# Patient Record
Sex: Male | Born: 1975
Health system: Southern US, Community
[De-identification: ages and names within clinical notes are randomized; demographics above are authoritative.]

## PROBLEM LIST (undated history)

## (undated) DIAGNOSIS — M3119 Other thrombotic microangiopathy: Secondary | ICD-10-CM

## (undated) DIAGNOSIS — M311 Thrombotic microangiopathy: Secondary | ICD-10-CM

## (undated) HISTORY — PX: ANKLE ARTHROPLASTY: SUR68

---
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
## 1999-01-22 ENCOUNTER — Encounter: Payer: Self-pay | Admitting: Emergency Medicine

## 1999-01-22 ENCOUNTER — Emergency Department (HOSPITAL_COMMUNITY): Admission: EM | Admit: 1999-01-22 | Discharge: 1999-01-22 | Payer: Self-pay | Admitting: Emergency Medicine

## 1999-06-22 ENCOUNTER — Emergency Department (HOSPITAL_COMMUNITY): Admission: EM | Admit: 1999-06-22 | Discharge: 1999-06-22 | Payer: Self-pay | Admitting: *Deleted

## 2008-06-11 ENCOUNTER — Emergency Department (HOSPITAL_BASED_OUTPATIENT_CLINIC_OR_DEPARTMENT_OTHER): Admission: EM | Admit: 2008-06-11 | Discharge: 2008-06-11 | Payer: Self-pay | Admitting: Emergency Medicine

## 2008-09-15 ENCOUNTER — Emergency Department (HOSPITAL_BASED_OUTPATIENT_CLINIC_OR_DEPARTMENT_OTHER): Admission: EM | Admit: 2008-09-15 | Discharge: 2008-09-15 | Payer: Self-pay | Admitting: Emergency Medicine

## 2009-07-31 ENCOUNTER — Emergency Department (HOSPITAL_BASED_OUTPATIENT_CLINIC_OR_DEPARTMENT_OTHER): Admission: EM | Admit: 2009-07-31 | Discharge: 2009-07-31 | Payer: Self-pay | Admitting: Emergency Medicine

## 2009-07-31 ENCOUNTER — Emergency Department (HOSPITAL_COMMUNITY): Admission: EM | Admit: 2009-07-31 | Discharge: 2009-07-31 | Payer: Self-pay | Admitting: Emergency Medicine

## 2009-07-31 ENCOUNTER — Ambulatory Visit: Payer: Self-pay | Admitting: Diagnostic Radiology

## 2010-12-18 LAB — GC/CHLAMYDIA PROBE AMP, GENITAL
Chlamydia, DNA Probe: NEGATIVE
GC Probe Amp, Genital: NEGATIVE

## 2011-06-05 LAB — POCT CARDIAC MARKERS: Troponin i, poc: <0.05

## 2011-06-05 LAB — DIFFERENTIAL
Basophils Absolute: 0.1
Lymphocytes Relative: 43
Lymphs Abs: 3.3
Monocytes Absolute: 0.5
Neutro Abs: 3.6

## 2011-06-05 LAB — CBC
HCT: 45
Hemoglobin: 15.1
RBC: 5.4
RDW: 13
WBC: 7.6

## 2011-11-07 ENCOUNTER — Encounter (HOSPITAL_BASED_OUTPATIENT_CLINIC_OR_DEPARTMENT_OTHER): Payer: Self-pay

## 2011-11-07 ENCOUNTER — Emergency Department (HOSPITAL_BASED_OUTPATIENT_CLINIC_OR_DEPARTMENT_OTHER)
Admission: EM | Admit: 2011-11-07 | Discharge: 2011-11-07 | Disposition: A | Discharge status: Home Health | Payer: Self-pay | Attending: Emergency Medicine | Admitting: Emergency Medicine

## 2011-11-07 ENCOUNTER — Other Ambulatory Visit: Payer: Self-pay

## 2011-11-07 DIAGNOSIS — R229 Localized swelling, mass and lump, unspecified: Secondary | ICD-10-CM | POA: Insufficient documentation

## 2011-11-07 DIAGNOSIS — R5381 Other malaise: Secondary | ICD-10-CM | POA: Insufficient documentation

## 2011-11-07 DIAGNOSIS — R42 Dizziness and giddiness: Secondary | ICD-10-CM | POA: Insufficient documentation

## 2011-11-07 DIAGNOSIS — L03319 Cellulitis of trunk, unspecified: Secondary | ICD-10-CM | POA: Insufficient documentation

## 2011-11-07 DIAGNOSIS — L02214 Cutaneous abscess of groin: Secondary | ICD-10-CM

## 2011-11-07 DIAGNOSIS — E860 Dehydration: Secondary | ICD-10-CM

## 2011-11-07 DIAGNOSIS — F17200 Nicotine dependence, unspecified, uncomplicated: Secondary | ICD-10-CM | POA: Insufficient documentation

## 2011-11-07 DIAGNOSIS — R5383 Other fatigue: Secondary | ICD-10-CM

## 2011-11-07 DIAGNOSIS — L02219 Cutaneous abscess of trunk, unspecified: Secondary | ICD-10-CM | POA: Insufficient documentation

## 2011-11-07 LAB — BASIC METABOLIC PANEL
BUN: 14 mg/dL (ref 6–23)
CO2: 23 mEq/L (ref 19–32)
Chloride: 102 mEq/L (ref 96–112)
GFR calc Af Amer: 90 mL/min — ABNORMAL LOW (ref >90)
Potassium: 3.5 mEq/L (ref 3.5–5.1)

## 2011-11-07 LAB — CBC
HCT: 44.1 % (ref 39.0–52.0)
Platelets: 280 10*3/uL (ref 150–400)
RBC: 5.33 MIL/uL (ref 4.22–5.81)
RDW: 14 % (ref 11.5–15.5)
WBC: 10.9 10*3/uL — ABNORMAL HIGH (ref 4.0–10.5)

## 2011-11-07 LAB — URINALYSIS, ROUTINE W REFLEX MICROSCOPIC
Leukocytes, UA: NEGATIVE
Nitrite: NEGATIVE
Specific Gravity, Urine: 1.029 (ref 1.005–1.030)
pH: 5.5 (ref 5.0–8.0)

## 2011-11-07 MED ORDER — SULFAMETHOXAZOLE-TRIMETHOPRIM 800-160 MG PO TABS: 1.0000 | ORAL_TABLET | Freq: Two times a day (BID) | ORAL | Status: AC

## 2011-11-07 MED ORDER — SULFAMETHOXAZOLE-TMP DS 800-160 MG PO TABS
1.0000 | ORAL_TABLET | Freq: Once | ORAL | Status: AC
  Administered 2011-11-07: 1 via ORAL
  Filled 2011-11-07: qty 1

## 2011-11-07 NOTE — ED Notes (Signed)
Pt sts abcess on right side there for 1 wk,also having chest pain moving down to left arm,pressure

## 2011-11-07 NOTE — ED Provider Notes (Signed)
History     CSN: [id number]  Arrival date & time 11/07/11  0155   None     Chief Complaint  Patient presents with  . Groin Swelling    (Consider location/radiation/quality/duration/timing/severity/associated sxs/prior treatment) HPI Comments: 36 year old male with no past medical history who presents with a complaint of abscess to the left groin. He states that this has been going on for approximately one week, gradually getting bigger until approximately 2 nights ago when the abscess ruptured allowing purulent material to come out. Since that time the pain has improved significantly, the abscess has continued to drain purulent material and has decreased in size significantly. He denies associated fevers nausea vomiting chills diarrhea. He does admit to having some lightheadedness with standing, dark-colored urine, admits to not drinking very much fluids and feeling generalized fatigue over the last couple of weeks. He states that he does not eat very frequently sometimes only one meal a day, continues to smoke cigarettes and states that "I just don't take care of myself like I should."  He does state that over the last 3 days he has had a dull feeling in his chest that gets worse when he takes a big breath and goes away when breathing normally. This is not exertional or positional. He denies coughing shortness of breath or swelling of the legs.    The history is provided by the patient.    History reviewed. No pertinent past medical history.  Past Surgical History  Procedure Date  . Ankle arthroplasty     No family history on file.  History  Substance Use Topics  . Smoking status: Current Everyday Smoker -- 2 years    Types: Cigars  . Smokeless tobacco: Not on file  . Alcohol Use: No      Review of Systems  Constitutional: Positive for fatigue. Negative for fever and chills.  HENT: Negative for sore throat and neck pain.   Eyes: Negative for visual disturbance.    Respiratory: Negative for cough and shortness of breath.   Cardiovascular: Positive for chest pain.  Gastrointestinal: Negative for nausea, vomiting, abdominal pain and diarrhea.  Genitourinary: Negative for dysuria and frequency.  Musculoskeletal: Negative for back pain.  Skin: Positive for rash.  Neurological: Negative for weakness, numbness and headaches.  Hematological: Negative for adenopathy.  Psychiatric/Behavioral: Negative for behavioral problems.    Allergies  Review of patient's allergies indicates no known allergies.  Home Medications   Current Outpatient Rx  Name Route Sig Dispense Refill  . SULFAMETHOXAZOLE-TRIMETHOPRIM 800-160 MG PO TABS Oral Take 1 tablet by mouth every 12 (twelve) hours. 20 tablet 0    BP 125/81  Pulse 88  Temp(Src) 97.7 F (36.5 C) (Oral)  Resp 20  SpO2 99%  Physical Exam  Nursing note and vitals reviewed. Constitutional: He appears well-developed and well-nourished. No distress.  HENT:  Head: Normocephalic and atraumatic.  Mouth/Throat: Oropharynx is clear and moist. No oropharyngeal exudate.  Eyes: Conjunctivae and EOM are normal. Pupils are equal, round, and reactive to light. Right eye exhibits no discharge. Left eye exhibits no discharge. No scleral icterus.  Neck: Normal range of motion. Neck supple. No JVD present. No thyromegaly present.  Cardiovascular: Normal rate, regular rhythm, normal heart sounds and intact distal pulses.  Exam reveals no gallop and no friction rub.   No murmur heard. Pulmonary/Chest: Effort normal and breath sounds normal. No respiratory distress. He has no wheezes. He has no rales.  Abdominal: Soft. Bowel sounds are normal. He exhibits  no distension and no mass. There is no tenderness.  Musculoskeletal: Normal range of motion. He exhibits no edema and no tenderness.  Lymphadenopathy:    He has no cervical adenopathy.  Neurological: He is alert. Coordination normal.  Skin: Skin is warm and dry. Rash  (Approximately 1.5 cm draining abscess in the left groin, not involving the scrotum or testicles. This is open, no purulent material seen, minimally tender without surrounding erythema or induration.) noted. No erythema.  Psychiatric: He has a normal mood and affect. His behavior is normal.    ED Course  Procedures (including critical care time)  ED ECG REPORT   Date: 11/07/2011   Rate: 80  Rhythm: normal sinus rhythm  QRS Axis: normal  Intervals: normal  ST/T Wave abnormalities: normal  Conduction Disutrbances:none  Narrative Interpretation: Normal ECG  Old EKG Reviewed: none available      Labs Reviewed  CBC - Abnormal; Notable for the following:    WBC 10.9 (*)    All other components within normal limits  BASIC METABOLIC PANEL - Abnormal; Notable for the following:    Glucose, Bld 152 (*)    GFR calc non Af Amer 78 (*)    GFR calc Af Amer 90 (*)    All other components within normal limits  URINALYSIS, ROUTINE W REFLEX MICROSCOPIC   No results found.   1. Abscess of groin, left   2. Fatigue   3. Dehydration       MDM  EKG is normal, will check to rule out diabetes, renal dysfunction, anemia, urinary tract infection. I suspect that dehydration is a big part of his symptom complex and have encouraged by mouth fluid intake.   Pt started on Bactrim, labs reviewed showing no signs of leukocytosis, anemia, renal failure or lyte abnormalities - his UA showed high SG, but no infection.  Fluids given by mouth, tolerated - started on bactrim, home with f/u - list for f/u given.  ECG is normal, pt has minimal non exertional sx and is very unlikely to be cardiac.  VS are normal without hypertension, hypoxia or fever.  Pulse of 88.  Discharge Prescriptions include:  #1 Bactrim     Vida Roller, MD 11/07/11 [phone]

## 2011-11-07 NOTE — Discharge Instructions (Signed)
Your tests have shown mild dehydration, but otherwise your blood counts and electrolytes and kidney tests were all normal - drink plenty of fluids - take the medicine called bactrim for your infection and return to your doctor or the ER for severe or worsening swelling, redness, or fevers.  STOP SMOKING immediately  RESOURCE GUIDE  Dental Problems  Patients with Medicaid: [facility] [phone] [address]                                           [phone] [address] Phone:  [phone]                                                  Phone:  [phone]  If unable to pay or uninsured, contact:  [facility] or [facility] to become qualified for the adult dental clinic.  Chronic Pain Problems Contact [facility]  [phone] Patients need to be referred by their primary care doctor.  Insufficient Money for Medicine Contact [employer]:  call "211" or [facility] [phone].  No Primary Care Doctor Call [facility]  [phone] Other agencies that provide inexpensive medical care    [facility]  [phone]    [facility]  [phone]    [facility]  [phone]    [facility]  [phone]    [facility]  [phone]    [facility]  [phone]  Psychological Services [facility] Behavioral Health  [phone] [facility] Services  [phone] [facility] Mental Health   [phone] (emergency services [phone])  Substance Abuse Resources [facility]  ([phone] [facility] [phone] [facility] [phone] Floydene Flock 3232574020 Residential & Outpatient Substance Abuse Program  [phone]  Abuse/Neglect [facility] Child Abuse Hotline 423-487-6330 [facility] Child Abuse Hotline (929) 525-2480 (After Hours)  Emergency Shelter [facility] [phone]) [phone]  Maternity Homes Room at  the [place] [facility] 516-686-1580 [facility] [phone]  MRSA Hotline #:   212-392-3637    [facility] Resources  [facility]     [employer]                          [facility] Dept. [address] [place]                       [address]      [address]  [place]                                                Cristobal Goldmann Phone:  [phone]  Phone:  [phone]                 Phone:  [phone]  [place] [facility] Phone:  [phone]  [place] Child Abuse Hotline (336) 342-1394 [phone]) [phone] (After Hours)   

## 2012-04-20 ENCOUNTER — Encounter (HOSPITAL_BASED_OUTPATIENT_CLINIC_OR_DEPARTMENT_OTHER): Payer: Self-pay | Admitting: *Deleted

## 2012-04-20 ENCOUNTER — Emergency Department (HOSPITAL_BASED_OUTPATIENT_CLINIC_OR_DEPARTMENT_OTHER)
Admission: EM | Admit: 2012-04-20 | Discharge: 2012-04-20 | Disposition: A | Discharge status: Home / Self Care | Payer: Self-pay | Attending: Emergency Medicine | Admitting: Emergency Medicine

## 2012-04-20 ENCOUNTER — Emergency Department (HOSPITAL_BASED_OUTPATIENT_CLINIC_OR_DEPARTMENT_OTHER): Payer: Self-pay

## 2012-04-20 DIAGNOSIS — R51 Headache: Secondary | ICD-10-CM | POA: Insufficient documentation

## 2012-04-20 DIAGNOSIS — F17200 Nicotine dependence, unspecified, uncomplicated: Secondary | ICD-10-CM | POA: Insufficient documentation

## 2012-04-20 LAB — CBC WITH DIFFERENTIAL/PLATELET
Basophils Relative: 0 % (ref 0–1)
Hemoglobin: 13.2 g/dL (ref 13.0–17.0)
Lymphocytes Relative: 45 % (ref 12–46)
Lymphs Abs: 4 10*3/uL (ref 0.7–4.0)
Monocytes Relative: 7 % (ref 3–12)
Neutro Abs: 4.1 10*3/uL (ref 1.7–7.7)
Neutrophils Relative %: 46 % (ref 43–77)
RBC: 4.75 MIL/uL (ref 4.22–5.81)

## 2012-04-20 LAB — BASIC METABOLIC PANEL
BUN: 14 mg/dL (ref 6–23)
Chloride: 102 mEq/L (ref 96–112)
GFR calc Af Amer: >90 mL/min (ref >90)
Glucose, Bld: 133 mg/dL — ABNORMAL HIGH (ref 70–99)
Potassium: 3.7 mEq/L (ref 3.5–5.1)

## 2012-04-20 MED ORDER — SODIUM CHLORIDE 0.9 % IV BOLUS (SEPSIS)
1000.0000 mL | Freq: Once | INTRAVENOUS | Status: AC
  Administered 2012-04-20: 1000 mL via INTRAVENOUS

## 2012-04-20 NOTE — ED Provider Notes (Signed)
History  This chart was scribed for Charles B. Bernette Mayers, MD by Shari Heritage. The patient was seen in room MH08/MH08. Patient's care was started at 1701.     CSN: [id number]  Arrival date & time 04/20/12  1701   First MD Initiated Contact with Patient 04/20/12 1709      Chief Complaint  Patient presents with  . Headache    The history is provided by the patient. No language interpreter was used.   Willie Cordova is a 36 y.o. male who presents to the Emergency Department complaining of a moderate to severe, throbbing HA onset several hours ago. Patient describes the pain as excruciating. He took Aleve with some moderate relief and the pain has improved significantly since the onset. No neck pain. No photophobia. No fever. Patient says that he has been hydrating by drinking about 1 gallon per day for the past 2 days. He states that he has been eating multiple small meals per day. He also drinks a Boost protein drink every morning. Patient says that he has been working with a new physical trainer since Thursday and has significantly increased his level of physical activity over the past few days. Patient says that he started using electronic cigarettes last week. He has been a current everyday smoker. He reports no significant medical history.   History reviewed. No pertinent past medical history.  Past Surgical History  Procedure Date  . Ankle arthroplasty     History reviewed. No pertinent family history.  History  Substance Use Topics  . Smoking status: Current Everyday Smoker -- 2 years    Types: Cigars  . Smokeless tobacco: Not on file  . Alcohol Use: No      Review of Systems A complete 10 system review of systems was obtained and all systems are negative except as noted in the HPI and PMH.   Allergies  Review of patient's allergies indicates no known allergies.  Home Medications  No current outpatient prescriptions on file.  BP 130/81  Pulse 74  Temp 98 F (36.7  C) (Oral)  Resp 20  Ht 6\' 1"  (1.854 m)  Wt 285 lb (129.275 kg)  BMI 37.60 kg/m2  SpO2 100%  Physical Exam  Nursing note and vitals reviewed. Constitutional: He is oriented to person, place, and time. He appears well-developed and well-nourished.  HENT:  Head: Normocephalic and atraumatic.  Eyes: EOM are normal. Pupils are equal, round, and reactive to light.  Neck: Normal range of motion. Neck supple.  Cardiovascular: Normal rate, normal heart sounds and intact distal pulses.   Pulmonary/Chest: Effort normal and breath sounds normal.  Abdominal: Bowel sounds are normal. He exhibits no distension. There is no tenderness.  Musculoskeletal: Normal range of motion. He exhibits no edema and no tenderness.  Neurological: He is alert and oriented to person, place, and time. He has normal strength. No cranial nerve deficit or sensory deficit.  Skin: Skin is warm and dry. No rash noted.  Psychiatric: He has a normal mood and affect.    ED Course  Procedures (including critical care time) DIAGNOSTIC STUDIES: Oxygen Saturation is 100% on room air, normal by my interpretation.    COORDINATION OF CARE: 5:22pm- Patient informed of current plan for treatment and evaluation and agrees with plan at this time.  Results for orders placed during the hospital encounter of 04/20/12  CBC WITH DIFFERENTIAL      Component Value Range   WBC 8.8  4.0 - 10.5 K/uL   RBC  4.75  4.22 - 5.81 MIL/uL   Hemoglobin 13.2  13.0 - 17.0 g/dL   HCT 24.4  01.0 - 27.2 %   MCV 82.5  78.0 - 100.0 fL   MCH 27.8  26.0 - 34.0 pg   MCHC 33.7  30.0 - 36.0 g/dL   RDW 53.6  64.4 - 03.4 %   Platelets 255  150 - 400 K/uL   Neutrophils Relative 46  43 - 77 %   Neutro Abs 4.1  1.7 - 7.7 K/uL   Lymphocytes Relative 45  12 - 46 %   Lymphs Abs 4.0  0.7 - 4.0 K/uL   Monocytes Relative 7  3 - 12 %   Monocytes Absolute 0.6  0.1 - 1.0 K/uL   Eosinophils Relative 1  0 - 5 %   Eosinophils Absolute 0.1  0.0 - 0.7 K/uL   Basophils  Relative 0  0 - 1 %   Basophils Absolute 0.0  0.0 - 0.1 K/uL  BASIC METABOLIC PANEL      Component Value Range   Sodium 141  135 - 145 mEq/L   Potassium 3.7  3.5 - 5.1 mEq/L   Chloride 102  96 - 112 mEq/L   CO2 26  19 - 32 mEq/L   Glucose, Bld 133 (*) 70 - 99 mg/dL   BUN 14  6 - 23 mg/dL   Creatinine, Ser 7.42  0.50 - 1.35 mg/dL   Calcium 9.4  8.4 - 59.5 mg/dL   GFR calc non Af Amer >90  >90 mL/min   GFR calc Af Amer >90  >90 mL/min   Ct Head Wo Contrast  04/20/2012  *RADIOLOGY REPORT*  Clinical Data: Headache.  CT HEAD WITHOUT CONTRAST  Technique:  Contiguous axial images were obtained from the base of the skull through the vertex without contrast.  Comparison: None.  Findings: The brain appears normal without evidence of infarct, hemorrhage, mass, mass effect, midline shift or abnormal extra- axial fluid collection.  There is no hydrocephalus or pneumocephalus.  The calvarium is intact.  Imaged paranasal sinuses and mastoid air cells are clear.  IMPRESSION: Negative exam.  Original Report Authenticated By: Bernadene Bell. Maricela Curet, M.D.       No diagnosis found.    MDM  Labs and imaging as above. Pt's headache has resolved. I had a long discussion with him regarding the diagnosis of SAH including LP. He will defer LP at this point. He was advised to return to the ER for any worsening headache or for any other concerns.      I personally performed the services described in the documentation, which were scribed in my presence. The recorded information has been reviewed and considered.     Charles B. Bernette Mayers, MD 04/20/12 6387

## 2012-04-20 NOTE — ED Notes (Signed)
Pt states he recently started working out with a trainer and while exercising develops "excruciating" headache. Took Aleve with some relief. Denies CP or other s/s.

## 2012-04-22 ENCOUNTER — Ambulatory Visit (HOSPITAL_BASED_OUTPATIENT_CLINIC_OR_DEPARTMENT_OTHER)
Admission: RE | Admit: 2012-04-22 | Discharge: 2012-04-22 | Disposition: A | Discharge status: Home / Self Care | Payer: Self-pay | Source: Ambulatory Visit | Attending: Emergency Medicine | Admitting: Emergency Medicine

## 2012-04-22 ENCOUNTER — Other Ambulatory Visit (HOSPITAL_BASED_OUTPATIENT_CLINIC_OR_DEPARTMENT_OTHER): Payer: Self-pay | Admitting: Emergency Medicine

## 2012-04-22 DIAGNOSIS — R51 Headache: Secondary | ICD-10-CM | POA: Insufficient documentation

## 2012-04-23 ENCOUNTER — Other Ambulatory Visit (HOSPITAL_BASED_OUTPATIENT_CLINIC_OR_DEPARTMENT_OTHER): Payer: Self-pay | Admitting: Emergency Medicine

## 2012-04-29 ENCOUNTER — Ambulatory Visit: Payer: Managed Care, Other (non HMO) | Admitting: Family

## 2012-04-29 DIAGNOSIS — Z0289 Encounter for other administrative examinations: Secondary | ICD-10-CM

## 2012-09-06 ENCOUNTER — Encounter (HOSPITAL_BASED_OUTPATIENT_CLINIC_OR_DEPARTMENT_OTHER): Payer: Self-pay | Admitting: *Deleted

## 2012-09-06 ENCOUNTER — Emergency Department (HOSPITAL_BASED_OUTPATIENT_CLINIC_OR_DEPARTMENT_OTHER)
Admission: EM | Admit: 2012-09-06 | Discharge: 2012-09-06 | Disposition: A | Discharge status: Home / Self Care | Payer: Self-pay | Attending: Emergency Medicine | Admitting: Emergency Medicine

## 2012-09-06 DIAGNOSIS — K044 Acute apical periodontitis of pulpal origin: Secondary | ICD-10-CM | POA: Insufficient documentation

## 2012-09-06 DIAGNOSIS — R51 Headache: Secondary | ICD-10-CM | POA: Insufficient documentation

## 2012-09-06 DIAGNOSIS — F17200 Nicotine dependence, unspecified, uncomplicated: Secondary | ICD-10-CM | POA: Insufficient documentation

## 2012-09-06 DIAGNOSIS — K047 Periapical abscess without sinus: Secondary | ICD-10-CM

## 2012-09-06 MED ORDER — OXYCODONE-ACETAMINOPHEN 5-325 MG PO TABS: 1.0000 | ORAL_TABLET | Freq: Four times a day (QID) | ORAL | Status: DC | PRN

## 2012-09-06 MED ORDER — ACETAMINOPHEN 325 MG PO TABS
650.0000 mg | ORAL_TABLET | Freq: Once | ORAL | Status: AC
  Administered 2012-09-06: 650 mg via ORAL
  Filled 2012-09-06: qty 2

## 2012-09-06 MED ORDER — AMOXICILLIN 500 MG PO CAPS: 500.0000 mg | ORAL_CAPSULE | Freq: Three times a day (TID) | ORAL | Status: DC

## 2012-09-06 NOTE — ED Provider Notes (Signed)
History     CSN: [id number]  Arrival date & time 09/06/12  7829   First MD Initiated Contact with Patient 09/06/12 1829      Chief Complaint  Patient presents with  . Dental Pain    (Consider location/radiation/quality/duration/timing/severity/associated sxs/prior treatment) HPI Comments: 37 year old male presents emergency department complaining of left lower tooth pain for the past couple weeks, worsening over the past few days. States he's had a problem on and off with this tooth in the past. He does not have a primary dentist. States he feels as if the left side of his face is beginning to swell. Describes the pain as sharp rated 10/10. Denies fever or chills. No difficulty swallowing. He tried taking BC powder without any relief of his pain. Nothing in specific makes the pain worse.  Patient is a 37 y.o. male presenting with tooth pain. The history is provided by the patient.  Dental PainThe primary symptoms include headaches. Primary symptoms do not include fever.  Additional symptoms do not include: trouble swallowing.    History reviewed. No pertinent past medical history.  Past Surgical History  Procedure Date  . Ankle arthroplasty     History reviewed. No pertinent family history.  History  Substance Use Topics  . Smoking status: Current Every Day Smoker -- 2 years    Types: Cigars  . Smokeless tobacco: Not on file  . Alcohol Use: No      Review of Systems  Constitutional: Negative for fever and chills.  HENT: Positive for dental problem. Negative for trouble swallowing.   Gastrointestinal: Negative for nausea and vomiting.  Neurological: Positive for headaches.    Allergies  Review of patient's allergies indicates no known allergies.  Home Medications  No current outpatient prescriptions on file.  BP 146/99  Pulse 75  Temp 98.6 F (37 C)  Resp 16  Ht 6\' 6"  (1.981 m)  Wt 275 lb (124.739 kg)  BMI 31.78 kg/m2  SpO2 99%  Physical Exam  Nursing  note and vitals reviewed. Constitutional: He is oriented to person, place, and time. He appears well-developed and well-nourished. No distress.  HENT:  Head: Normocephalic and atraumatic.    Mouth/Throat: Uvula is midline and oropharynx is clear and moist. Abnormal dentition. Dental caries present. No dental abscesses.    Eyes: Conjunctivae normal are normal.  Neck: Normal range of motion. Neck supple.  Cardiovascular: Normal rate, regular rhythm and normal heart sounds.   Pulmonary/Chest: Effort normal and breath sounds normal.  Musculoskeletal: Normal range of motion. He exhibits no edema.  Lymphadenopathy:       Head (left side): Submandibular adenopathy present.  Neurological: He is alert and oriented to person, place, and time.  Skin: Skin is warm and dry.  Psychiatric: He has a normal mood and affect. His behavior is normal.    ED Course  Procedures (including critical care time)  Labs Reviewed - No data to display No results found.   1. Dental infection       MDM   Dental pain associated with dental infection. No evidence of dental abscess. Patient is afebrile, non toxic appearing and swallowing secretions well. I gave patient referral to dentist and stressed the importance of dental follow up for ultimate management of dental pain. I will also give amoxicillin and pain control. Patient voices understanding and is agreeable to plan.         Trevor Mace, PA-C 09/06/12 1844

## 2012-09-06 NOTE — ED Notes (Signed)
Pt c/o wisdom tooth pain x 2 days

## 2012-09-06 NOTE — ED Provider Notes (Signed)
Medical screening examination/treatment/procedure(s) were performed by non-physician practitioner and as supervising physician I was immediately available for consultation/collaboration.   Dione Booze, MD 09/06/12 [phone]

## 2013-09-07 IMAGING — CT CT HEAD W/O CM
1 series · 16 of 30 positions shown, 20 images · non-contrast
Comparison: None.

CLINICAL DATA: Headache.

CT HEAD WITHOUT CONTRAST
TECHNIQUE: Contiguous axial images were obtained from the base of
the skull through the vertex without contrast.

[Series 2: head 4.8 h37s · axial · 0.51mm/px · z∈[-104,+56]mm · 16 of 36 slices shown, 20 images]
[im 2/36  brain]
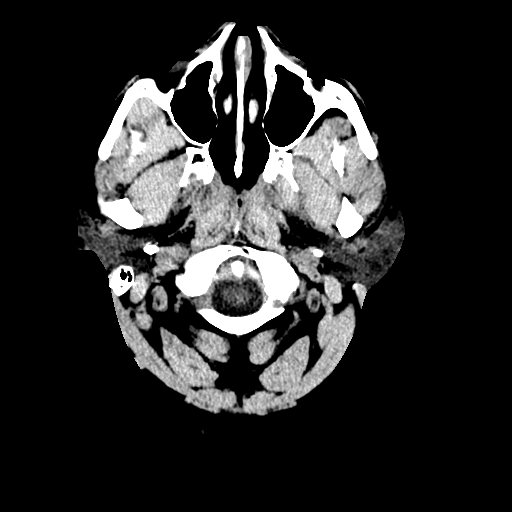
[im 2/36  bone]
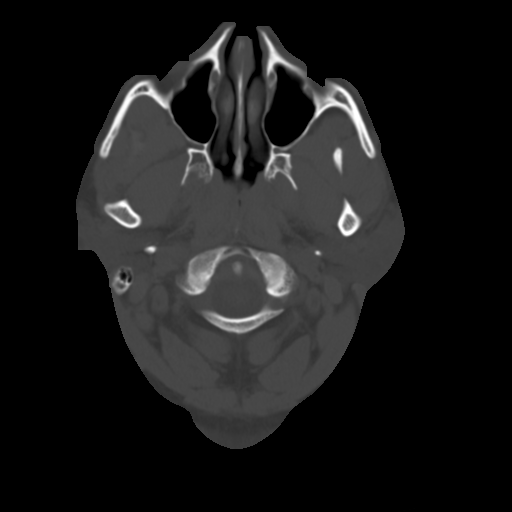
[im 4/36  brain]
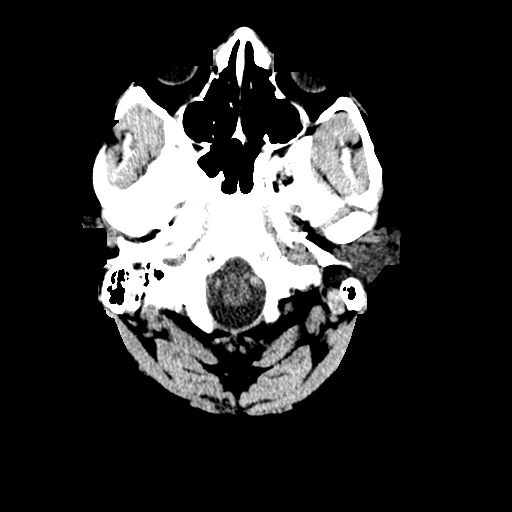
[im 7/36  brain]
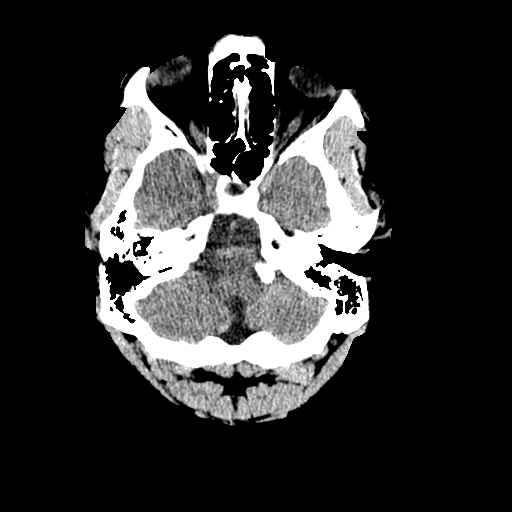
[im 9/36  brain]
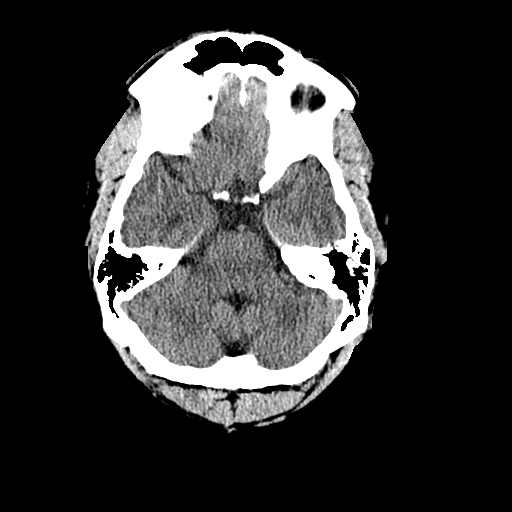
[im 10/36  brain]
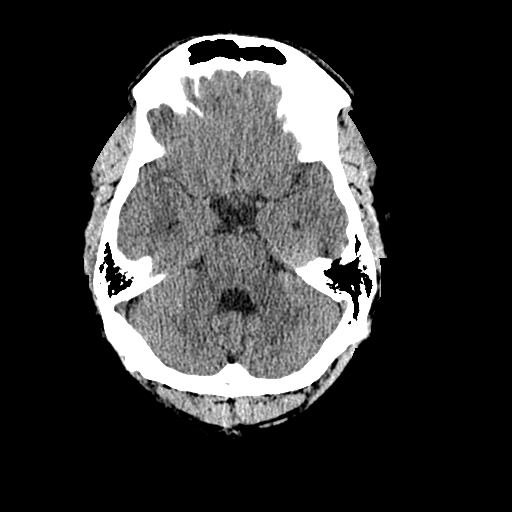
[im 10/36  bone]
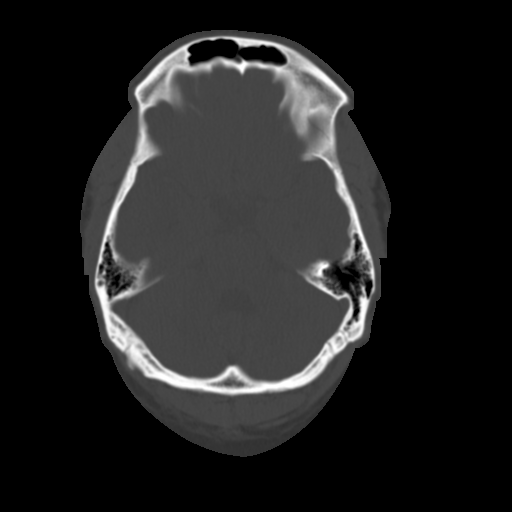
[im 13/36  brain]
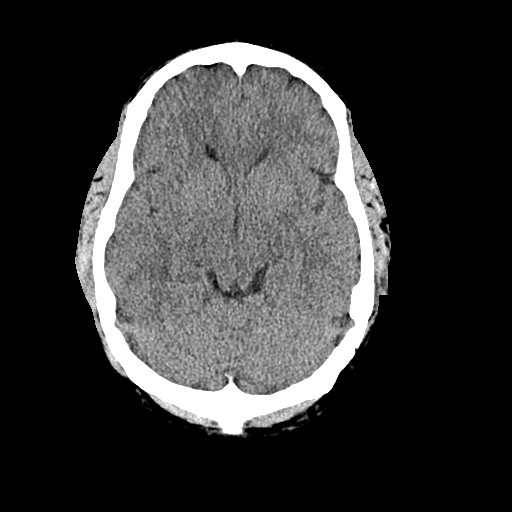
[im 15/36  brain]
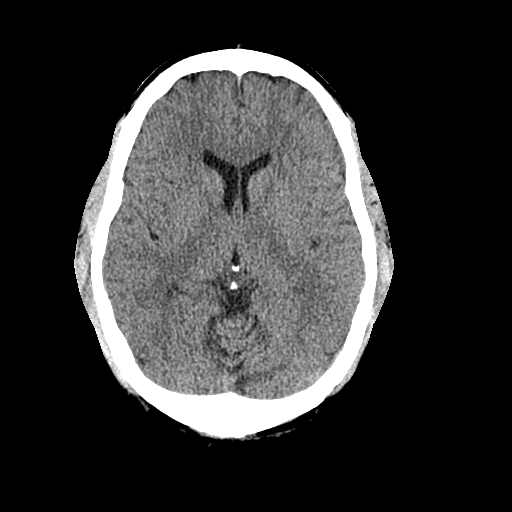
[im 17/36  brain]
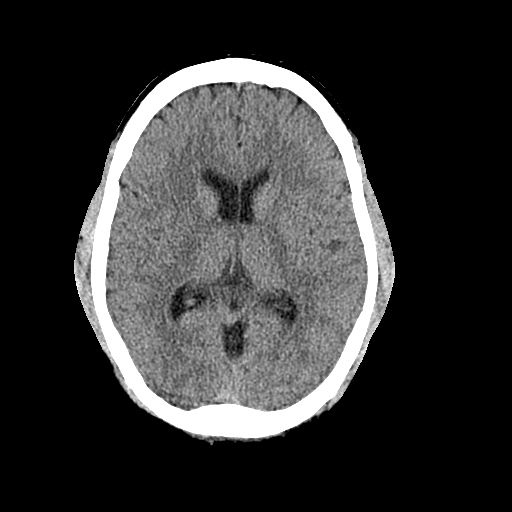
[im 19/36  brain]
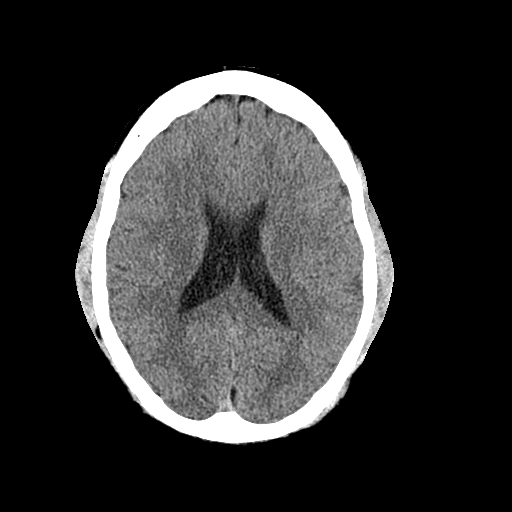
[im 19/36  bone]
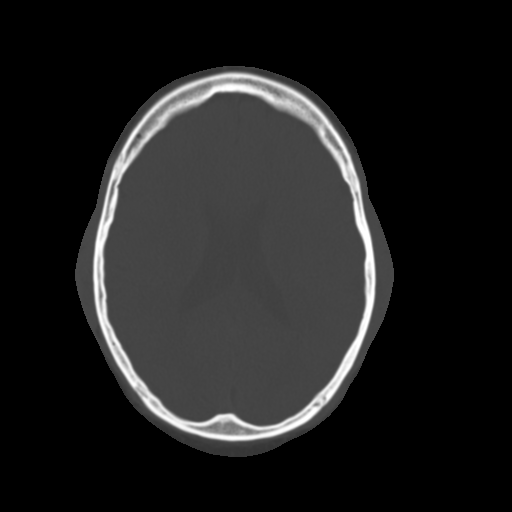
[im 21/36  brain]
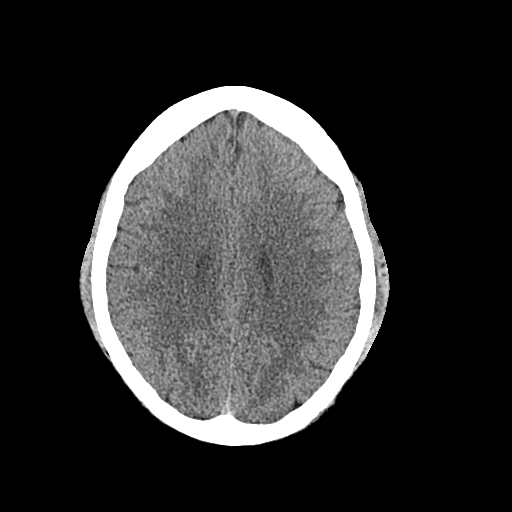
[im 23/36  brain]
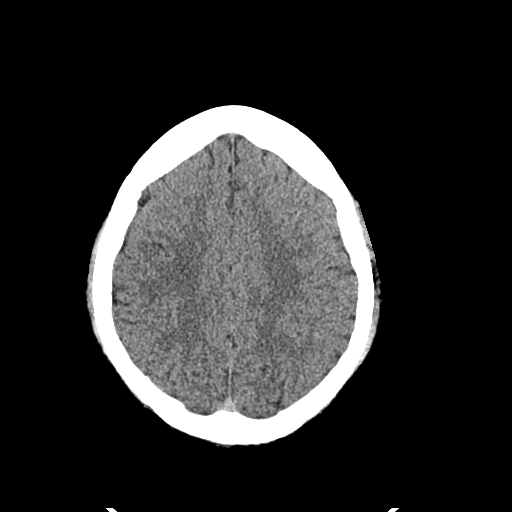
[im 26/36  brain]
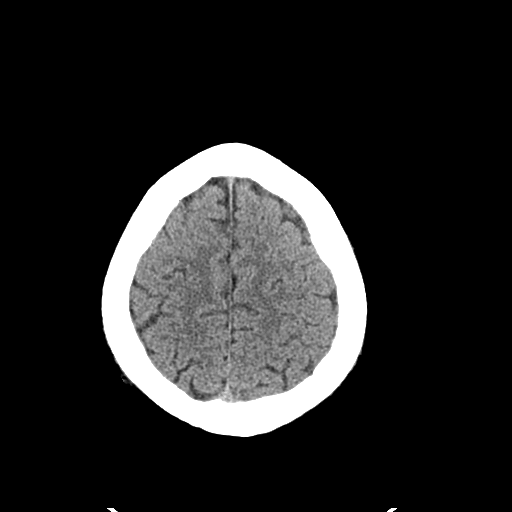
[im 27/36  brain]
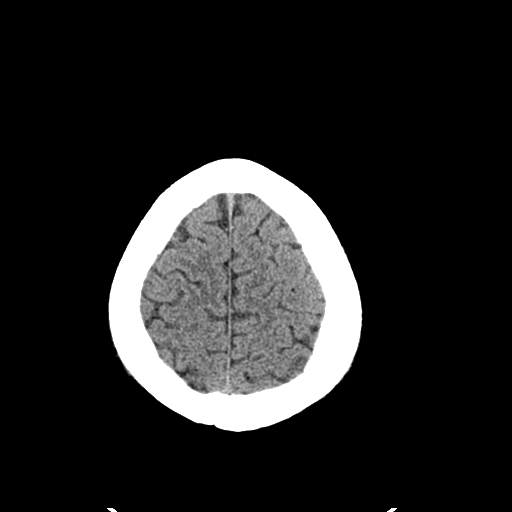
[im 27/36  bone]
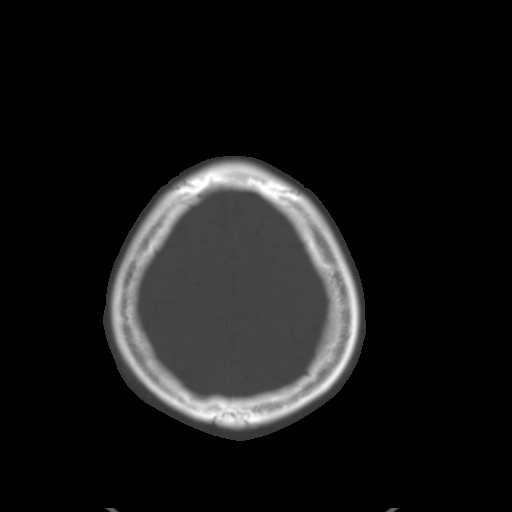
[im 29/36  brain]
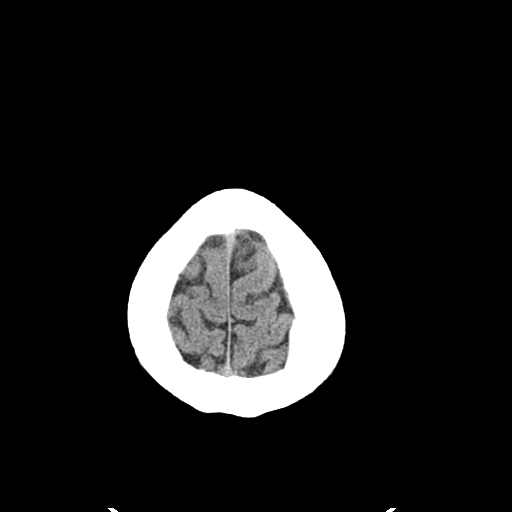
[im 32/36  brain]
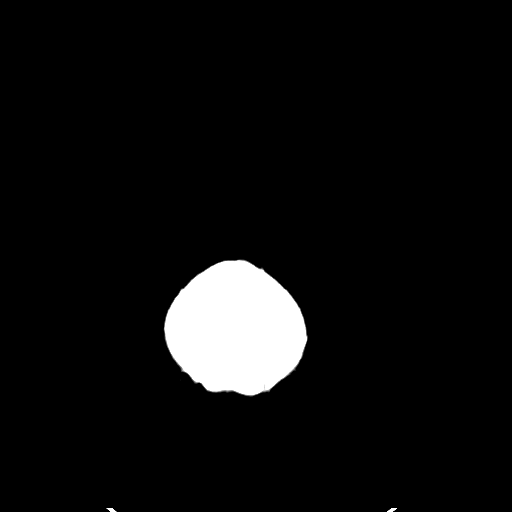
[im 34/36  brain]
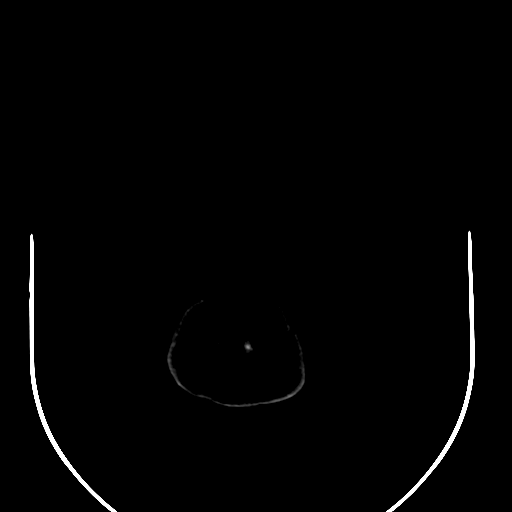

[16 of 30 positions shown; findings below may reference images not displayed]

FINDINGS: The brain appears normal without evidence of infarct,
hemorrhage, mass, mass effect, midline shift or abnormal extra-
axial fluid collection.  There is no hydrocephalus or
pneumocephalus.  The calvarium is intact.  Imaged paranasal sinuses
and mastoid air cells are clear.
IMPRESSION: Negative exam.

## 2013-12-31 ENCOUNTER — Encounter (HOSPITAL_BASED_OUTPATIENT_CLINIC_OR_DEPARTMENT_OTHER): Payer: Self-pay | Admitting: Emergency Medicine

## 2013-12-31 ENCOUNTER — Emergency Department (HOSPITAL_BASED_OUTPATIENT_CLINIC_OR_DEPARTMENT_OTHER)
Admission: EM | Admit: 2013-12-31 | Discharge: 2014-01-01 | Disposition: A | Discharge status: Home / Self Care | Payer: Managed Care, Other (non HMO) | Attending: Emergency Medicine | Admitting: Emergency Medicine

## 2013-12-31 DIAGNOSIS — Z792 Long term (current) use of antibiotics: Secondary | ICD-10-CM | POA: Insufficient documentation

## 2013-12-31 DIAGNOSIS — R300 Dysuria: Secondary | ICD-10-CM

## 2013-12-31 DIAGNOSIS — F17200 Nicotine dependence, unspecified, uncomplicated: Secondary | ICD-10-CM | POA: Insufficient documentation

## 2013-12-31 NOTE — ED Notes (Signed)
Pain at tip of penis just prior to voiding.  Denies discharge.

## 2013-12-31 NOTE — ED Provider Notes (Signed)
CSN: [id number]     Arrival date & time 12/31/13  2340 History   First MD Initiated Contact with Patient 12/31/13 2356     Chief Complaint  Patient presents with  . Penis Pain     (Consider location/radiation/quality/duration/timing/severity/associated sxs/prior Treatment) HPI This is a 38 year old male with a one week history of a mild to moderate pain in the end of his penis when he needs to urinate. He denies urethral discharge. He denies fever or chills. He denies nausea, vomiting or diarrhea. He denies hematuria. He normally uses condoms but did have a condom break earlier in the month.  History reviewed. No pertinent past medical history. Past Surgical History  Procedure Laterality Date  . Ankle arthroplasty     No family history on file. History  Substance Use Topics  . Smoking status: Current Every Day Smoker -- 2 years    Types: Cigars  . Smokeless tobacco: Not on file  . Alcohol Use: Yes     Comment: rarely    Review of Systems  All other systems reviewed and are negative.   Allergies  Review of patient's allergies indicates no known allergies.  Home Medications   Prior to Admission medications   Medication Sig Start Date End Date Taking? Authorizing Provider  amoxicillin (AMOXIL) 500 MG capsule Take 1 capsule (500 mg total) by mouth 3 (three) times daily. 09/06/12   Trevor Maceobyn M Albert, PA-C  oxyCODONE-acetaminophen (PERCOCET) 5-325 MG per tablet Take 1-2 tablets by mouth every 6 (six) hours as needed for pain. 09/06/12   Trevor Maceobyn M Albert, PA-C   BP 145/94  Pulse 84  Temp(Src) 98.8 F (37.1 C) (Oral)  Resp 20  Ht 6' (1.829 m)  Wt 285 lb (129.275 kg)  BMI 38.64 kg/m2  SpO2 99%  Physical Exam General: Well-developed, well-nourished male in no acute distress; appearance consistent with age of record HENT: normocephalic; atraumatic Eyes: pupils equal, round and reactive to light; extraocular muscles intact Neck: supple Heart: regular rate and rhythm Lungs: clear  to auscultation bilaterally Abdomen: soft; nondistended; nontender; no masses or hepatosplenomegaly; bowel sounds present GU: Tanner 5 male, circumcised; no urethral discharge; no testicular tenderness; prostate nontender but difficult to reach with examining finger Extremities: No deformity; full range of motion Neurologic: Awake, alert and oriented; motor function intact in all extremities and symmetric; no facial droop Skin: Warm and dry Psychiatric: Normal mood and affect    ED Course  Procedures (including critical care time)  MDM   Nursing notes and vitals signs, including pulse oximetry, reviewed.  Summary of this visit's results, reviewed by myself:  Labs:  Results for orders placed during the hospital encounter of 12/31/13 (from the past 24 hour(s))  URINALYSIS, ROUTINE W REFLEX MICROSCOPIC     Status: None   Collection Time    12/31/13 11:50 PM      Result Value Ref Range   Color, Urine YELLOW  YELLOW   APPearance CLEAR  CLEAR   Specific Gravity, Urine 1.030  1.005 - 1.030   pH 5.5  5.0 - 8.0   Glucose, UA NEGATIVE  NEGATIVE mg/dL   Hgb urine dipstick NEGATIVE  NEGATIVE   Bilirubin Urine NEGATIVE  NEGATIVE   Ketones, ur NEGATIVE  NEGATIVE mg/dL   Protein, ur NEGATIVE  NEGATIVE mg/dL   Urobilinogen, UA 1.0  0.0 - 1.0 mg/dL   Nitrite NEGATIVE  NEGATIVE   Leukocytes, UA NEGATIVE  NEGATIVE   Will treat for prostatitis as history is suggestive of prostatic  etiology and prostate exam was limited.      Hanley SeamenJohn L Jeselle Hiser, MD 01/01/14 0010

## 2014-01-01 LAB — URINALYSIS, ROUTINE W REFLEX MICROSCOPIC
Bilirubin Urine: NEGATIVE
GLUCOSE, UA: NEGATIVE mg/dL
HGB URINE DIPSTICK: NEGATIVE
KETONES UR: NEGATIVE mg/dL
LEUKOCYTES UA: NEGATIVE
Nitrite: NEGATIVE
PROTEIN: NEGATIVE mg/dL
Specific Gravity, Urine: 1.03 (ref 1.005–1.030)
UROBILINOGEN UA: 1 mg/dL (ref 0.0–1.0)
pH: 5.5 (ref 5.0–8.0)

## 2014-01-01 LAB — GC/CHLAMYDIA PROBE AMP
CT PROBE, AMP APTIMA: NEGATIVE
GC PROBE AMP APTIMA: NEGATIVE

## 2014-01-01 MED ORDER — DOXYCYCLINE HYCLATE 100 MG PO CAPS: ORAL_CAPSULE | ORAL | Status: DC

## 2014-01-01 MED ORDER — DOXYCYCLINE HYCLATE 100 MG PO TABS
100.0000 mg | ORAL_TABLET | Freq: Once | ORAL | Status: AC
  Administered 2014-01-01: 100 mg via ORAL
  Filled 2014-01-01: qty 1

## 2014-01-01 MED ORDER — CEFTRIAXONE SODIUM 250 MG IJ SOLR
250.0000 mg | Freq: Once | INTRAMUSCULAR | Status: AC
  Administered 2014-01-01: 250 mg via INTRAMUSCULAR
  Filled 2014-01-01: qty 250

## 2014-01-01 NOTE — ED Notes (Signed)
C/o penile pain x 1 week

## 2014-01-01 NOTE — Discharge Instructions (Signed)
Dysuria Dysuria is the medical term for pain with urination. There are many causes for dysuria, but urinary tract infection (including prostatitis) is the most common. If a urinalysis was performed it can show that there is a urinary tract infection. A urine culture confirms that you or your child is sick. You will need to follow up with a healthcare provider because:  If a urine culture was done you will need to know the culture results and treatment recommendations.  If the urine culture was positive, you or your child will need to be put on antibiotics or know if the antibiotics prescribed are the right antibiotics for your urinary tract infection.  If the urine culture is negative (no urinary tract infection), then other causes may need to be explored or antibiotics need to be stopped. Today laboratory work may have been done and there does not seem to be an infection. If cultures were done they will take at least 24 to 48 hours to be completed. Today x-rays may have been taken and they read as normal. No cause can be found for the problems. The x-rays may be re-read by a radiologist and you will be contacted if additional findings are made. You or your child may have been put on medications to help with this problem until you can see your primary caregiver. If the problems get better, see your primary caregiver if the problems return. If you were given antibiotics (medications which kill germs), take all of the mediations as directed for the full course of treatment.  If laboratory work was done, you need to find the results. Leave a telephone number where you can be reached. If this is not possible, make sure you find out how you are to get test results. HOME CARE INSTRUCTIONS   Drink lots of fluids. For adults, drink eight, 8 ounce glasses of clear juice or water a day. For children, replace fluids as suggested by your caregiver.  Empty the bladder often. Avoid holding urine for long periods  of time.  After a bowel movement, women should cleanse front to back, using each tissue only once.  Empty your bladder before and after sexual intercourse.  Take all the medicine given to you until it is gone. You may feel better in a few days, but TAKE ALL MEDICINE.  Avoid caffeine, tea, alcohol and carbonated beverages, because they tend to irritate the bladder.  In men, alcohol may irritate the prostate.  Only take over-the-counter or prescription medicines for pain, discomfort, or fever as directed by your caregiver.  If your caregiver has given you a follow-up appointment, it is very important to keep that appointment. Not keeping the appointment could result in a chronic or permanent injury, pain, and disability. If there is any problem keeping the appointment, you must call back to this facility for assistance. SEEK IMMEDIATE MEDICAL CARE IF:   Back pain develops.  A fever develops.  There is nausea (feeling sick to your stomach) or vomiting (throwing up).  Problems are no better with medications or are getting worse. MAKE SURE YOU:   Understand these instructions.  Will watch your condition.  Will get help right away if you are not doing well or get worse. Document Released: 05/18/2004 Document Revised: 11/12/2011 Document Reviewed: 03/25/2008 [facility] Patient Information 2014 [employer], [place].

## 2014-01-02 LAB — URINE CULTURE
Colony Count: NO GROWTH
Culture: NO GROWTH

## 2015-01-04 ENCOUNTER — Encounter (HOSPITAL_BASED_OUTPATIENT_CLINIC_OR_DEPARTMENT_OTHER): Payer: Self-pay

## 2015-01-04 ENCOUNTER — Emergency Department (HOSPITAL_BASED_OUTPATIENT_CLINIC_OR_DEPARTMENT_OTHER)
Admission: EM | Admit: 2015-01-04 | Discharge: 2015-01-04 | Disposition: A | Discharge status: Home / Self Care | Payer: Self-pay | Attending: Emergency Medicine | Admitting: Emergency Medicine

## 2015-01-04 DIAGNOSIS — S99921S Unspecified injury of right foot, sequela: Secondary | ICD-10-CM | POA: Insufficient documentation

## 2015-01-04 DIAGNOSIS — Z792 Long term (current) use of antibiotics: Secondary | ICD-10-CM | POA: Insufficient documentation

## 2015-01-04 DIAGNOSIS — Z720 Tobacco use: Secondary | ICD-10-CM | POA: Insufficient documentation

## 2015-01-04 DIAGNOSIS — X58XXXS Exposure to other specified factors, sequela: Secondary | ICD-10-CM | POA: Insufficient documentation

## 2015-01-04 DIAGNOSIS — L723 Sebaceous cyst: Secondary | ICD-10-CM | POA: Insufficient documentation

## 2015-01-04 NOTE — ED Notes (Signed)
Pt c/o rt big toe discoloration and "a hole in the nail", pt states hit his toe 3wks ago; toe nail is black and lifted at the base of toe with discoloration around the toe

## 2015-01-04 NOTE — ED Provider Notes (Addendum)
CSN: [id number]     Arrival date & time 01/04/15  0102 History   First MD Initiated Contact with Patient 01/04/15 0301     Chief Complaint  Patient presents with  . Toe Pain     (Consider location/radiation/quality/duration/timing/severity/associated sxs/prior Treatment) HPI  This is a 39 year old male who stubbed his right great toe about 3 weeks ago. The toe has been painful off and on. He states it is not painful at the present time. He is concerned because it appears that the nail is falling off and he wants to make sure that it is not infected. There is been no drainage from it. There is no erythema.  He also has several bumps on his scrotum which he is concerned may be genital warts.  History reviewed. No pertinent past medical history. Past Surgical History  Procedure Laterality Date  . Ankle arthroplasty     No family history on file. History  Substance Use Topics  . Smoking status: Current Every Day Smoker -- 2 years    Types: Cigars  . Smokeless tobacco: Not on file  . Alcohol Use: Yes     Comment: rarely    Review of Systems  All other systems reviewed and are negative.   Allergies  Review of patient's allergies indicates no known allergies.  Home Medications   Prior to Admission medications   Medication Sig Start Date End Date Taking? Authorizing Provider  amoxicillin (AMOXIL) 500 MG capsule Take 1 capsule (500 mg total) by mouth 3 (three) times daily. 09/06/12   Kathrynn Speedobyn M Hess, PA-C  doxycycline (VIBRAMYCIN) 100 MG capsule Take one capsule twice daily for 14 days. 01/01/14   Tinlee Navarrette, MD  oxyCODONE-acetaminophen (PERCOCET) 5-325 MG per tablet Take 1-2 tablets by mouth every 6 (six) hours as needed for pain. 09/06/12   Robyn M Hess, PA-C   BP 116/78 mmHg  Pulse 82  Temp(Src) 98.3 F (36.8 C) (Oral)  Resp 18  Ht 6' (1.829 m)  Wt 280 lb (127.007 kg)  BMI 37.97 kg/m2  SpO2 95%   Physical Exam  General: Well-developed, well-nourished male in no acute  distress; appearance consistent with age of record HENT: normocephalic; atraumatic Eyes: Normal appearance Neck: supple Heart: regular rate and rhythm Lungs: Normal respiratory effort and excursion Abdomen: soft; nondistended GU: Several small nontender nodules of the scrotal skin consistent with sebaceous cysts Extremities: No deformity; full range of motion; incomplete deciduous reaction of chronically thickened right great toenail with new nail palpable in the gap between the proximal aspect of the old nail and the nail fold, no erythema, warmth or purulent drainage Neurologic: Awake, alert and oriented; motor function intact in all extremities and symmetric; no facial droop Skin: Warm and dry Psychiatric: Normal mood and affect    ED Course  Procedures (including critical care time)   MDM  Patient was reassured that his toe is not infected and that the affected nail will continue to grow out until it falls off. He will likely grow a new nail in its place.  Paula LibraJohn Carri Spillers, MD 01/04/15 [id number]  Paula LibraJohn Lancer Thurner, MD 01/04/15 [phone]

## 2015-01-04 NOTE — ED Notes (Signed)
MD at bedside. 

## 2016-08-24 ENCOUNTER — Encounter (HOSPITAL_BASED_OUTPATIENT_CLINIC_OR_DEPARTMENT_OTHER): Payer: Self-pay | Admitting: *Deleted

## 2016-08-24 ENCOUNTER — Emergency Department (HOSPITAL_BASED_OUTPATIENT_CLINIC_OR_DEPARTMENT_OTHER)
Admission: EM | Admit: 2016-08-24 | Discharge: 2016-08-24 | Disposition: A | Discharge status: Home / Self Care | Payer: Self-pay | Attending: Emergency Medicine | Admitting: Emergency Medicine

## 2016-08-24 DIAGNOSIS — R0981 Nasal congestion: Secondary | ICD-10-CM | POA: Insufficient documentation

## 2016-08-24 DIAGNOSIS — R300 Dysuria: Secondary | ICD-10-CM | POA: Insufficient documentation

## 2016-08-24 DIAGNOSIS — F17290 Nicotine dependence, other tobacco product, uncomplicated: Secondary | ICD-10-CM | POA: Insufficient documentation

## 2016-08-24 LAB — URINALYSIS, ROUTINE W REFLEX MICROSCOPIC
BILIRUBIN URINE: NEGATIVE
GLUCOSE, UA: NEGATIVE mg/dL
HGB URINE DIPSTICK: NEGATIVE
KETONES UR: NEGATIVE mg/dL
LEUKOCYTES UA: NEGATIVE
Nitrite: NEGATIVE
PH: 5.5 (ref 5.0–8.0)
PROTEIN: NEGATIVE mg/dL
Specific Gravity, Urine: 1.025 (ref 1.005–1.030)

## 2016-08-24 MED ORDER — CEFTRIAXONE SODIUM 250 MG IJ SOLR
250.0000 mg | Freq: Once | INTRAMUSCULAR | Status: AC
  Administered 2016-08-24: 250 mg via INTRAMUSCULAR
  Filled 2016-08-24: qty 250

## 2016-08-24 MED ORDER — DOXYCYCLINE HYCLATE 100 MG PO CAPS: 100.0000 mg | ORAL_CAPSULE | Freq: Two times a day (BID) | ORAL | 0 refills | Status: DC

## 2016-08-24 MED ORDER — MOMETASONE FUROATE 50 MCG/ACT NA SUSP: 2.0000 | Freq: Every day | NASAL | 12 refills | Status: DC

## 2016-08-24 MED ORDER — DOXYCYCLINE HYCLATE 100 MG PO TABS
100.0000 mg | ORAL_TABLET | Freq: Two times a day (BID) | ORAL | Status: DC
  Administered 2016-08-24: 100 mg via ORAL
  Filled 2016-08-24: qty 1

## 2016-08-24 NOTE — ED Triage Notes (Signed)
Pt c/o URi symptoms x 2 weeks also c/o urinary freq and pain x 1 week

## 2016-08-24 NOTE — ED Provider Notes (Signed)
[facility] Provider Note: Lowella DellJ. Lane Leandro Berkowitz, MD, FACEP  CSN: [id number] MRN: [id number] ARRIVAL: 08/24/16 at 0009 ROOM: [facility]01/[facility]01   CHIEF COMPLAINT  URI   HISTORY OF PRESENT ILLNESS  Willie Cordova is a 40 y.o. male with a two-week history of nasal congestion. He is in using over-the-counter oxymetazoline twice daily for about 2 weeks. He is not getting adequate relief from this. He is also here with a one-week history of moderate discomfort at the end of his penis with urination, dribbling and urinary frequency. He denies urethral discharge. He had similar symptoms 2 years ago for which I saw him. He had a difficult prostate exam but he was treated for prostatitis at the time and states he got adequate relief with the treatment regimen. He denies fever, chills, nausea, vomiting or diarrhea.   History reviewed. No pertinent past medical history.  Past Surgical History:  Procedure Laterality Date  . ANKLE ARTHROPLASTY      History reviewed. No pertinent family history.  Social History  Substance Use Topics  . Smoking status: Current Every Day Smoker    Years: 2.00    Types: Cigars  . Smokeless tobacco: Not on file  . Alcohol use Yes     Comment: rarely    Prior to Admission medications   Medication Sig Start Date End Date Taking? Authorizing Provider  doxycycline (VIBRAMYCIN) 100 MG capsule Take 1 capsule (100 mg total) by mouth 2 (two) times daily. One po bid x 7 days 08/24/16   Paula LibraJohn Annalynn Centanni, MD  mometasone (NASONEX) 50 MCG/ACT nasal spray Place 2 sprays into the nose daily. 08/24/16   Paula LibraJohn Marilyn Wing, MD    Allergies Patient has no known allergies.   REVIEW OF SYSTEMS  Negative except as noted here or in the History of Present Illness.   PHYSICAL EXAMINATION  Initial Vital Signs Blood pressure 148/95, pulse 75, temperature 98.2 F (36.8 C), resp. rate 18, height 6' (1.829 m), weight 280 lb (127 kg), SpO2 99 %.  Examination General: Well-developed,  well-nourished male in no acute distress; appearance consistent with age of record HENT: normocephalic; atraumatic Eyes: pupils equal, round and reactive to light; extraocular muscles intact Neck: supple Heart: regular rate and rhythm Lungs: clear to auscultation bilaterally Abdomen: soft; nondistended; nontender; no masses or hepatosplenomegaly; bowel sounds present GU: Prostate exam declined Extremities: No deformity; full range of motion Neurologic: Awake, alert and oriented; motor function intact in all extremities and symmetric; no facial droop Skin: Warm and dry Psychiatric: Normal mood and affect   RESULTS  Summary of this visit's results, reviewed by myself:   EKG Interpretation  Date/Time:    Ventricular Rate:    PR Interval:    QRS Duration:   QT Interval:    QTC Calculation:   R Axis:     Text Interpretation:        Laboratory Studies: Results for orders placed or performed during the hospital encounter of 08/24/16 (from the past 24 hour(s))  Urinalysis, Routine w reflex microscopic     Status: None   Collection Time: 08/24/16 12:15 AM  Result Value Ref Range   Color, Urine YELLOW YELLOW   APPearance CLEAR CLEAR   Specific Gravity, Urine 1.025 1.005 - 1.030   pH 5.5 5.0 - 8.0   Glucose, UA NEGATIVE NEGATIVE mg/dL   Hgb urine dipstick NEGATIVE NEGATIVE   Bilirubin Urine NEGATIVE NEGATIVE   Ketones, ur NEGATIVE NEGATIVE mg/dL   Protein, ur NEGATIVE NEGATIVE mg/dL   Nitrite  NEGATIVE NEGATIVE   Leukocytes, UA NEGATIVE NEGATIVE   Imaging Studies: No results found.  ED COURSE  Nursing notes and initial vitals signs, including pulse oximetry, reviewed.  Vitals:   08/24/16 0012 08/24/16 0013  BP:  148/95  Pulse:  75  Resp:  18  Temp:  98.2 F (36.8 C)  SpO2:  99%  Weight: 280 lb (127 kg)   Height: 6' (1.829 m)    1:19 AM Patient was advised to discontinue the oxymetazoline and we will recommend nasal steroid. We will treat him for prostatitis as  before.  PROCEDURES    ED DIAGNOSES     ICD-9-CM ICD-10-CM   1. Dysuria 788.1 R30.0   2. Nasal congestion 478.19 R09.81        Paula LibraJohn Xoey Warmoth, MD 08/24/16 [phone]

## 2016-08-24 NOTE — ED Notes (Signed)
Pt verbalizes understanding of d/c instructions and denies any further needs at this time. 

## 2016-08-24 NOTE — ED Notes (Signed)
Pt c/o nasal congestion for the last 2 weeks that has been unrelieved with tylenol cold for the last few days.  He also c/o pain at the end of urination for the last week.  Pt denies fevers, denies flank pain, denies penile discharge.

## 2016-08-28 LAB — GC/CHLAMYDIA PROBE AMP (~~LOC~~) NOT AT ARMC
CHLAMYDIA, DNA PROBE: NEGATIVE
NEISSERIA GONORRHEA: NEGATIVE

## 2018-01-30 ENCOUNTER — Other Ambulatory Visit: Payer: Self-pay

## 2018-01-30 ENCOUNTER — Emergency Department (HOSPITAL_BASED_OUTPATIENT_CLINIC_OR_DEPARTMENT_OTHER)
Admission: EM | Admit: 2018-01-30 | Discharge: 2018-01-30 | Disposition: A | Discharge status: Home / Self Care | Payer: Self-pay | Attending: Emergency Medicine | Admitting: Emergency Medicine

## 2018-01-30 ENCOUNTER — Encounter (HOSPITAL_BASED_OUTPATIENT_CLINIC_OR_DEPARTMENT_OTHER): Payer: Self-pay

## 2018-01-30 DIAGNOSIS — F17290 Nicotine dependence, other tobacco product, uncomplicated: Secondary | ICD-10-CM | POA: Insufficient documentation

## 2018-01-30 DIAGNOSIS — J069 Acute upper respiratory infection, unspecified: Secondary | ICD-10-CM | POA: Insufficient documentation

## 2018-01-30 LAB — RAPID STREP SCREEN (MED CTR MEBANE ONLY): Streptococcus, Group A Screen (Direct): NEGATIVE

## 2018-01-30 MED ORDER — AZITHROMYCIN 250 MG PO TABS: 250.0000 mg | ORAL_TABLET | Freq: Every day | ORAL | 0 refills | Status: DC

## 2018-01-30 MED ORDER — FLUTICASONE PROPIONATE 50 MCG/ACT NA SUSP: 2.0000 | Freq: Every day | NASAL | 2 refills | Status: DC

## 2018-01-30 MED ORDER — PREDNISONE 20 MG PO TABS: 40.0000 mg | ORAL_TABLET | Freq: Every day | ORAL | 0 refills | Status: DC

## 2018-01-30 MED ORDER — PSEUDOEPHEDRINE HCL 30 MG PO TABS: 30.0000 mg | ORAL_TABLET | ORAL | 0 refills | Status: DC | PRN

## 2018-01-30 MED FILL — FLUTICASONE PROP 50 MCG SPR: 50 | 60 days supply | Qty: 16 | Fill #0

## 2018-01-30 MED FILL — SUDOGEST 30 MG TABLET: 30 | 5 days supply | Qty: 30 | Fill #0

## 2018-01-30 MED FILL — AZITHROMYCIN 250 MG TABLET: 250 | 5 days supply | Qty: 6 | Fill #0

## 2018-01-30 MED FILL — predniSONE 20 MG TABS: 20 | 5 days supply | Qty: 10 | Fill #0

## 2018-01-30 NOTE — ED Triage Notes (Signed)
Pt c/o sore throat x2wks, feels like strep, denies fever

## 2018-01-30 NOTE — Discharge Instructions (Addendum)
Take tylenol or motrin for pain. Sudafed and flonase for congestion. Zithromax for infection until all gone. Follow up with family doctor or return if worsening.

## 2018-01-30 NOTE — ED Provider Notes (Signed)
[facility] EMERGENCY DEPARTMENT Provider Note   CSN: [id number] Arrival date & time: 01/30/18  4098     History   Chief Complaint Chief Complaint  Patient presents with  . Sore Throat    HPI Willie Cordova is a 42 y.o. male.  HPI Willie Cordova is a 42 y.o. male presents emergency department complaining of sore throat, nasal congestion, sinus pain and pressure, cough for 2 weeks.  Patient states that his symptoms are worsening instead of improving.  He states he is unable to breathe through his nose.  He reports headaches.  He reports productive cough with colored sputum.  He states he feels like his throat is swollen and it is difficult to swallow although he is able to swallow both liquids and solids.  He denies any known fever.  He states he has had some sick contacts at work.  He denies any nausea or vomiting.  Denies any diarrhea.  No chest pain or shortness of breath.  He has tried Tylenol Cold and flu with no relief.  No history of asthma, but he is an everyday smoker.  History reviewed. No pertinent past medical history.  There are no active problems to display for this patient.   Past Surgical History:  Procedure Laterality Date  . ANKLE ARTHROPLASTY          Home Medications    Prior to Admission medications   Not on File    Family History No family history on file.  Social History Social History   Tobacco Use  . Smoking status: Current Every Day Smoker    Years: 2.00    Types: Cigars  . Smokeless tobacco: Never Used  Substance Use Topics  . Alcohol use: Yes    Comment: rarely  . Drug use: No     Allergies   Patient has no known allergies.   Review of Systems Review of Systems  Constitutional: Negative for chills and fever.  HENT: Positive for congestion, sore throat and trouble swallowing.   Respiratory: Positive for cough. Negative for chest tightness and shortness of breath.   Cardiovascular: Negative for chest pain,  palpitations and leg swelling.  Gastrointestinal: Negative for abdominal distention, abdominal pain, diarrhea, nausea and vomiting.  Genitourinary: Negative for dysuria, frequency, hematuria and urgency.  Musculoskeletal: Negative for arthralgias, myalgias, neck pain and neck stiffness.  Skin: Negative for rash.  Allergic/Immunologic: Negative for immunocompromised state.  Neurological: Positive for headaches. Negative for dizziness, weakness, light-headedness and numbness.  All other systems reviewed and are negative.    Physical Exam Updated Vital Signs BP 126/84 (BP Location: Left Arm)   Pulse 73   Temp 98.4 F (36.9 C) (Oral)   Resp 18   SpO2 99%   Physical Exam  Constitutional: He is oriented to person, place, and time. He appears well-developed and well-nourished. No distress.  HENT:  Head: Normocephalic and atraumatic.  Right Ear: External ear normal.  Left Ear: External ear normal.  Mouth/Throat: Oropharynx is clear and moist.  Clear rhinorrhea, pharynx erythemous, uvula midline  Eyes: Conjunctivae are normal.  Neck: Normal range of motion. Neck supple.  No meningeal signs  Cardiovascular: Normal rate, regular rhythm and normal heart sounds.  Pulmonary/Chest: Effort normal and breath sounds normal. No respiratory distress. He has no wheezes. He has no rales.  Abdominal: Soft. Bowel sounds are normal. There is no tenderness.  Musculoskeletal: He exhibits no edema or tenderness.  Lymphadenopathy:    He has no cervical adenopathy.  Neurological:  He is alert and oriented to person, place, and time.  Skin: Skin is warm and dry. No erythema.  Psychiatric: He has a normal mood and affect.  Nursing note and vitals reviewed.    ED Treatments / Results  Labs (all labs ordered are listed, but only abnormal results are displayed) Labs Reviewed  RAPID STREP SCREEN ([facility] ONLY)  CULTURE, GROUP A STREP [facility])    EKG None  Radiology No results  found.  Procedures Procedures (including critical care time)  Medications Ordered in ED Medications - No data to display   Initial Impression / Assessment and Plan / ED Course  I have reviewed the triage vital signs and the nursing notes.  Pertinent labs & imaging results that were available during my care of the patient were reviewed by me and considered in my medical decision making (see chart for details).     Patient in emergency department with flulike symptoms.  He is afebrile, but he is very congested, coughing, lungs are clear to auscultation.  He does report wheezing at home, and he is a smoker.  Since symptoms have been going on for 2 weeks and are worsening, will place him on Z-Pak in case this is turning into a bacterial infection.  We did discuss that this is most likely a virus however, and he just may need more time and take medications that would help him with his symptoms.  Will discharge home with Tylenol/Motrin for his body aches and pain.  We will give prednisone for throat swelling and wheezing.  Advised to take Sudafed for congestion.  Z-Pak.  Follow-up if not improving in 5 to 7 days.  Vitals:   01/30/18 1001  BP: 126/84  Pulse: 73  Resp: 18  Temp: 98.4 F (36.9 C)  TempSrc: Oral  SpO2: 99%     Final Clinical Impressions(s) / ED Diagnoses   Final diagnoses:  Upper respiratory tract infection, unspecified type    ED Discharge Orders        Ordered    azithromycin (ZITHROMAX) 250 MG tablet  Daily     01/30/18 1136    predniSONE (DELTASONE) 20 MG tablet  Daily     01/30/18 1136    pseudoephedrine (SUDAFED) 30 MG tablet  Every 4 hours PRN     01/30/18 1136    fluticasone (FLONASE) 50 MCG/ACT nasal spray  Daily     01/30/18 1137       Jaynie Crumble, PA-C 01/30/18 1139    Little, Ambrose Finland, MD 01/30/18 1549

## 2018-02-01 LAB — CULTURE, GROUP A STREP (THRC)

## 2018-12-12 ENCOUNTER — Other Ambulatory Visit: Payer: Self-pay

## 2018-12-12 ENCOUNTER — Emergency Department (HOSPITAL_BASED_OUTPATIENT_CLINIC_OR_DEPARTMENT_OTHER)
Admission: EM | Admit: 2018-12-12 | Discharge: 2018-12-12 | Disposition: A | Discharge status: Home / Self Care | Payer: Self-pay | Attending: Emergency Medicine | Admitting: Emergency Medicine

## 2018-12-12 ENCOUNTER — Encounter (HOSPITAL_BASED_OUTPATIENT_CLINIC_OR_DEPARTMENT_OTHER): Payer: Self-pay | Admitting: *Deleted

## 2018-12-12 DIAGNOSIS — F17210 Nicotine dependence, cigarettes, uncomplicated: Secondary | ICD-10-CM | POA: Insufficient documentation

## 2018-12-12 DIAGNOSIS — I10 Essential (primary) hypertension: Secondary | ICD-10-CM | POA: Insufficient documentation

## 2018-12-12 DIAGNOSIS — Z79899 Other long term (current) drug therapy: Secondary | ICD-10-CM | POA: Insufficient documentation

## 2018-12-12 MED ORDER — HYDROCHLOROTHIAZIDE 12.5 MG PO TABS: 25.0000 mg | ORAL_TABLET | Freq: Every day | ORAL | 3 refills | Status: AC

## 2018-12-12 MED FILL — HYDROCHLOROTHIAZIDE 12.5 MG: 12.5 | 15 days supply | Qty: 30 | Fill #0

## 2018-12-12 NOTE — ED Provider Notes (Signed)
[facility] EMERGENCY DEPARTMENT Provider Note   CSN: [id number] Arrival date & time: 12/12/18  0908    History   Chief Complaint Chief Complaint  Patient presents with   Elevated BP    HPI Willie Cordova is a 43 y.o. male.  He has no significant past medical history.  He said he felt a little lightheaded yesterday and took his blood pressure and was getting readings of 240/120.  He feels a little bit better today but was worried so wanted to get it checked out.  No headache no blurry vision no chest pain no shortness of breath.  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  He is using a home blood pressure monitor that goes around his wrist.     The history is provided by the patient.  Hypertension  This is a new problem. The current episode started yesterday. The problem occurs constantly. The problem has not changed since onset.Pertinent negatives include no chest pain, no abdominal pain, no headaches and no shortness of breath. The symptoms are aggravated by stress. Nothing relieves the symptoms. He has tried nothing for the symptoms. The treatment provided no relief.    History reviewed. No pertinent past medical history.  There are no active problems to display for this patient.   Past Surgical History:  Procedure Laterality Date   ANKLE ARTHROPLASTY          Home Medications    Prior to Admission medications   Medication Sig Start Date End Date Taking? Authorizing Provider  azithromycin (ZITHROMAX) 250 MG tablet Take 1 tablet (250 mg total) by mouth daily. Take first 2 tablets together, then 1 every day until finished. 01/30/18   Kirichenko, Tatyana, PA-C  fluticasone (FLONASE) 50 MCG/ACT nasal spray Place 2 sprays into both nostrils daily. 01/30/18   Kirichenko, Tatyana, PA-C  predniSONE (DELTASONE) 20 MG tablet Take 2 tablets (40 mg total) by mouth daily. 01/30/18    Kirichenko, Tatyana, PA-C  pseudoephedrine (SUDAFED) 30 MG tablet Take 1 tablet (30 mg total) by mouth every 4 (four) hours as needed for congestion. 01/30/18   Jaynie Crumble, PA-C    Family History History reviewed. No pertinent family history.  Social History Social History   Tobacco Use   Smoking status: Current Every Day Smoker    Years: 2.00    Types: Cigars   Smokeless tobacco: Never Used  Substance Use Topics   Alcohol use: Not Currently    Comment: rarely   Drug use: No     Allergies   Patient has no known allergies.   Review of Systems Review of Systems  Constitutional: Negative for fever.  HENT: Negative for sore throat.   Eyes: Negative for visual disturbance.  Respiratory: Negative for shortness of breath.   Cardiovascular: Negative for chest pain.  Gastrointestinal: Negative for abdominal pain.  Genitourinary: Negative for dysuria.  Musculoskeletal: Negative for back pain.  Skin: Negative for rash.  Neurological: Positive for light-headedness. Negative for headaches.     Physical Exam Updated Vital Signs BP (!) 146/101 (BP Location: Right Arm)    Pulse 66    Temp 97.9 F (36.6 C) (Oral)    Resp 16    Ht 6' (1.829 m)    Wt 129.3 kg    SpO2 100%    BMI 38.65 kg/m   Physical Exam Vitals signs and nursing note reviewed.  Constitutional:  Appearance: He is well-developed.  HENT:     Head: Normocephalic and atraumatic.  Eyes:     Conjunctiva/sclera: Conjunctivae normal.  Neck:     Musculoskeletal: Neck supple.  Cardiovascular:     Rate and Rhythm: Normal rate and regular rhythm.     Heart sounds: No murmur.  Pulmonary:     Effort: Pulmonary effort is normal. No respiratory distress.     Breath sounds: Normal breath sounds.  Abdominal:     Palpations: Abdomen is soft.     Tenderness: There is no abdominal tenderness.  Musculoskeletal: Normal range of motion.     Right lower leg: No edema.     Left lower leg: No edema.  Skin:     General: Skin is warm and dry.  Neurological:     General: No focal deficit present.     Mental Status: He is alert and oriented to person, place, and time.     Gait: Gait normal.      ED Treatments / Results  Labs (all labs ordered are listed, but only abnormal results are displayed) Labs Reviewed - No data to display  EKG None  Radiology No results found.  Procedures Procedures (including critical care time)  Medications Ordered in ED Medications - No data to display   Initial Impression / Assessment and Plan / ED Course  I have reviewed the triage vital signs and the nursing notes.  Pertinent labs & imaging results that were available during my care of the patient were reviewed by me and considered in my medical decision making (see chart for details).  Clinical Course as of Dec 12 1220  Fri Dec 12, 2018  [id number] Patient here with concern for elevated blood pressure.  He is asymptomatic currently.  Blood pressure were getting here was 140s over 10o, 140s over 90s.  When he checks with his home monitor that he brought in with him he still getting 240/140.  We discussed lifestyle modifications like getting some exercise and eating a better diet losing some weight.  He also needs to work on his stress level.  Being a black male who is probably slightly overweight with a family history of hypertension and the is reasonable start him on HCTZ.  He understands he will need to follow-up with a primary care doctor for continued management of this.  I do not think he needs any lab work currently as he is having no symptoms.   [MB]    Clinical Course User Index [MB] Terrilee FilesButler, Ameliah Baskins C, MD       Final Clinical Impressions(s) / ED Diagnoses   Final diagnoses:  Essential hypertension    ED Discharge Orders         Ordered    hydrochlorothiazide (HYDRODIURIL) 12.5 MG tablet  Daily     12/12/18 0952           Terrilee FilesButler, Daisi Kentner C, MD 12/13/18 1223

## 2018-12-12 NOTE — ED Triage Notes (Signed)
Elevated BP since yesterday waking up very tired.

## 2018-12-12 NOTE — Discharge Instructions (Addendum)
You were seen in the emergency department for elevated blood pressure.  The readings we got here were a lot better than you were getting with your home monitor.  We are starting you on a low-dose of a blood pressure medication.  It would be important for you to establish care with a primary care doctor so they can continue to monitor your blood pressure along with other preventative care.  Please return if any concerns.

## 2019-12-21 ENCOUNTER — Other Ambulatory Visit: Payer: Self-pay

## 2019-12-21 ENCOUNTER — Emergency Department (HOSPITAL_BASED_OUTPATIENT_CLINIC_OR_DEPARTMENT_OTHER)
Admission: EM | Admit: 2019-12-21 | Discharge: 2019-12-21 | Disposition: A | Discharge status: Other Acute Inpatient Hospital | Payer: Self-pay | Attending: Emergency Medicine | Admitting: Emergency Medicine

## 2019-12-21 ENCOUNTER — Encounter (HOSPITAL_BASED_OUTPATIENT_CLINIC_OR_DEPARTMENT_OTHER): Payer: Self-pay | Admitting: Emergency Medicine

## 2019-12-21 DIAGNOSIS — Z20822 Contact with and (suspected) exposure to covid-19: Secondary | ICD-10-CM | POA: Insufficient documentation

## 2019-12-21 DIAGNOSIS — D696 Thrombocytopenia, unspecified: Secondary | ICD-10-CM | POA: Insufficient documentation

## 2019-12-21 DIAGNOSIS — Z79899 Other long term (current) drug therapy: Secondary | ICD-10-CM | POA: Insufficient documentation

## 2019-12-21 DIAGNOSIS — F17210 Nicotine dependence, cigarettes, uncomplicated: Secondary | ICD-10-CM | POA: Insufficient documentation

## 2019-12-21 DIAGNOSIS — R5383 Other fatigue: Secondary | ICD-10-CM | POA: Insufficient documentation

## 2019-12-21 LAB — CBC WITH DIFFERENTIAL/PLATELET
Abs Immature Granulocytes: 0.89 10*3/uL — ABNORMAL HIGH (ref 0.00–0.07)
Basophils Absolute: 0.1 10*3/uL (ref 0.0–0.1)
Basophils Relative: 1 %
Eosinophils Absolute: 0.1 10*3/uL (ref 0.0–0.5)
Eosinophils Relative: 1 %
HCT: 30 % — ABNORMAL LOW (ref 39.0–52.0)
Hemoglobin: 9.8 g/dL — ABNORMAL LOW (ref 13.0–17.0)
Immature Granulocytes: 8 %
Lymphocytes Relative: 28 %
Lymphs Abs: 3 10*3/uL (ref 0.7–4.0)
MCH: 27.3 pg (ref 26.0–34.0)
MCHC: 32.7 g/dL (ref 30.0–36.0)
MCV: 83.6 fL (ref 80.0–100.0)
Monocytes Absolute: 0.9 10*3/uL (ref 0.1–1.0)
Monocytes Relative: 8 %
Neutro Abs: 5.8 10*3/uL (ref 1.7–7.7)
Neutrophils Relative %: 54 %
Platelets: 10 10*3/uL — CL (ref 150–400)
RBC: 3.59 MIL/uL — ABNORMAL LOW (ref 4.22–5.81)
RDW: 19.9 % — ABNORMAL HIGH (ref 11.5–15.5)
Smear Review: NORMAL
WBC: 10.7 10*3/uL — ABNORMAL HIGH (ref 4.0–10.5)
nRBC: 2.2 % — ABNORMAL HIGH (ref 0.0–0.2)

## 2019-12-21 LAB — HEPATIC FUNCTION PANEL
ALT: 24 U/L (ref 0–44)
AST: 47 U/L — ABNORMAL HIGH (ref 15–41)
Albumin: 4.6 g/dL (ref 3.5–5.0)
Alkaline Phosphatase: 67 U/L (ref 38–126)
Bilirubin, Direct: 0.5 mg/dL — ABNORMAL HIGH (ref 0.0–0.2)
Indirect Bilirubin: 2.3 mg/dL — ABNORMAL HIGH (ref 0.3–0.9)
Total Bilirubin: 2.8 mg/dL — ABNORMAL HIGH (ref 0.3–1.2)
Total Protein: 8.2 g/dL — ABNORMAL HIGH (ref 6.5–8.1)

## 2019-12-21 LAB — URINALYSIS, MICROSCOPIC (REFLEX)

## 2019-12-21 LAB — SARS CORONAVIRUS 2 (TAT 6-24 HRS): SARS Coronavirus 2: NEGATIVE

## 2019-12-21 LAB — URINALYSIS, ROUTINE W REFLEX MICROSCOPIC
Glucose, UA: NEGATIVE mg/dL
Ketones, ur: 15 mg/dL — AB
Leukocytes,Ua: NEGATIVE
Nitrite: POSITIVE — AB
Protein, ur: >300 mg/dL — AB
Specific Gravity, Urine: 1.025 (ref 1.005–1.030)
pH: 6 (ref 5.0–8.0)

## 2019-12-21 LAB — BASIC METABOLIC PANEL
Anion gap: 14 (ref 5–15)
BUN: 17 mg/dL (ref 6–20)
CO2: 21 mmol/L — ABNORMAL LOW (ref 22–32)
Calcium: 9.4 mg/dL (ref 8.9–10.3)
Chloride: 103 mmol/L (ref 98–111)
Creatinine, Ser: 1.22 mg/dL (ref 0.61–1.24)
GFR calc Af Amer: >60 mL/min (ref >60)
GFR calc non Af Amer: >60 mL/min (ref >60)
Glucose, Bld: 183 mg/dL — ABNORMAL HIGH (ref 70–99)
Potassium: 3.9 mmol/L (ref 3.5–5.1)
Sodium: 138 mmol/L (ref 135–145)

## 2019-12-21 LAB — PROTIME-INR
INR: 1.1 (ref 0.8–1.2)
Prothrombin Time: 13.9 seconds (ref 11.4–15.2)

## 2019-12-21 NOTE — ED Notes (Addendum)
Spoke with [facility] transport departments, [facility] rm [phone] phone # [phone] or [phone]   Transport estimated to be less than 1 hr out.

## 2019-12-21 NOTE — ED Triage Notes (Signed)
Generalized weakness x 1 week. States he can barely walk around the house. Received COVID vaccine about a week before symptoms started. Denies pain. Denies N/V/D

## 2019-12-21 NOTE — ED Provider Notes (Signed)
[facility] EMERGENCY DEPARTMENT Provider Note   CSN: [id number] Arrival date & time: 12/21/19  1200     History Chief Complaint  Patient presents with  . Weakness    Willie Cordova is a 44 y.o. male.  He does not have any significant past medical history.  He said he got the [employer] Covid vaccine about 11 days ago.  About a week ago he took 2 tablets of amoxicillin for dental pain that he thought might be an infection.  That caused him to vomit so he stopped taking those medicines.  For the last few days he has felt extremely weak.  Sleeping a lot.  Decreased appetite.  Noticed a bruise on his left inner biceps that seem to be getting bigger.  Urine was tea colored today.  Having a little bit of left upper quadrant abdominal pain.  No headache no gingival bleeding no gross hematuria or passing blood from his bottom.  The history is provided by the patient.  Weakness Severity:  Severe Onset quality:  Gradual Duration:  7 days Timing:  Constant Progression:  Worsening Chronicity:  New Context: not drug use and not recent infection   Relieved by:  Nothing Worsened by:  Activity Ineffective treatments:  None tried Associated symptoms: abdominal pain and difficulty walking (from weakness)   Associated symptoms: no chest pain, no cough, no diarrhea, no dysuria, no falls, no fever, no frequency, no headaches, no hematochezia, no loss of consciousness, no melena, no nausea, no shortness of breath, no vision change and no vomiting   Risk factors: new medications (vaccine and amoxil)        History reviewed. No pertinent past medical history.  There are no problems to display for this patient.   Past Surgical History:  Procedure Laterality Date  . ANKLE ARTHROPLASTY         No family history on file.  Social History   Tobacco Use  . Smoking status: Current Every Day Smoker    Years: 2.00    Types: Cigars  . Smokeless tobacco: Never Used  Substance  Use Topics  . Alcohol use: Not Currently    Comment: rarely  . Drug use: No    Home Medications Prior to Admission medications   Medication Sig Start Date End Date Taking? Authorizing Provider  hydrochlorothiazide (HYDRODIURIL) 12.5 MG tablet Take 2 tablets (25 mg total) by mouth daily. 12/12/18   Hayden Rasmussen, MD    Allergies    Patient has no known allergies.  Review of Systems   Review of Systems  Constitutional: Positive for activity change, appetite change and fatigue. Negative for fever.  HENT: Negative for sore throat.   Eyes: Negative for visual disturbance.  Respiratory: Negative for cough and shortness of breath.   Cardiovascular: Negative for chest pain.  Gastrointestinal: Positive for abdominal pain. Negative for diarrhea, hematochezia, melena, nausea and vomiting.  Genitourinary: Negative for dysuria and frequency.  Musculoskeletal: Negative for falls and neck pain.  Skin: Negative for rash.  Neurological: Positive for weakness. Negative for loss of consciousness and headaches.  Hematological: Bruises/bleeds easily.    Physical Exam Updated Vital Signs BP (!) 143/99 (BP Location: Right Arm)   Pulse 94   Temp 98.9 F (37.2 C) (Oral)   Resp 20   Ht 6\' 1"  (1.854 m)   Wt 129.3 kg   SpO2 100%   BMI 37.60 kg/m   Physical Exam Vitals and nursing note reviewed.  Constitutional:  Appearance: Normal appearance. He is well-developed.  HENT:     Head: Normocephalic and atraumatic.  Eyes:     Extraocular Movements: Extraocular movements intact.     Conjunctiva/sclera: Conjunctivae normal.     Pupils: Pupils are equal, round, and reactive to light.  Cardiovascular:     Rate and Rhythm: Normal rate and regular rhythm.     Pulses: Normal pulses.     Heart sounds: No murmur.  Pulmonary:     Effort: Pulmonary effort is normal. No respiratory distress.     Breath sounds: Normal breath sounds.  Abdominal:     Palpations: Abdomen is soft.     Tenderness:  There is abdominal tenderness (luq). There is no guarding or rebound.  Musculoskeletal:     Cervical back: Neck supple.     Comments: Approximately 4 cm bruise left inner biceps area  Skin:    General: Skin is warm and dry.     Capillary Refill: Capillary refill takes less than 2 seconds.  Neurological:     General: No focal deficit present.     Mental Status: He is alert and oriented to person, place, and time.     Sensory: No sensory deficit.     Motor: No weakness.     ED Results / Procedures / Treatments   Labs (all labs ordered are listed, but only abnormal results are displayed) Labs Reviewed  URINALYSIS, ROUTINE W REFLEX MICROSCOPIC - Abnormal; Notable for the following components:      Result Value   Color, Urine AMBER (*)    APPearance CLOUDY (*)    Hgb urine dipstick LARGE (*)    Bilirubin Urine MODERATE (*)    Ketones, ur 15 (*)    Protein, ur >300 (*)    Nitrite POSITIVE (*)    All other components within normal limits  BASIC METABOLIC PANEL - Abnormal; Notable for the following components:   CO2 21 (*)    Glucose, Bld 183 (*)    All other components within normal limits  URINALYSIS, MICROSCOPIC (REFLEX) - Abnormal; Notable for the following components:   Bacteria, UA MANY (*)    All other components within normal limits  CBC WITH DIFFERENTIAL/PLATELET - Abnormal; Notable for the following components:   WBC 10.7 (*)    RBC 3.59 (*)    Hemoglobin 9.8 (*)    HCT 30.0 (*)    RDW 19.9 (*)    Platelets 10 (*)    nRBC 2.2 (*)    Abs Immature Granulocytes 0.89 (*)    All other components within normal limits  HEPATIC FUNCTION PANEL - Abnormal; Notable for the following components:   Total Protein 8.2 (*)    AST 47 (*)    Total Bilirubin 2.8 (*)    Bilirubin, Direct 0.5 (*)    Indirect Bilirubin 2.3 (*)    All other components within normal limits  SARS CORONAVIRUS 2 (TAT 6-24 HRS)  URINE CULTURE  PROTIME-INR    EKG EKG  Interpretation  Date/Time:  Monday December 21 2019 12:18:59 EDT Ventricular Rate:  103 PR Interval:  150 QRS Duration: 82 QT Interval:  326 QTC Calculation: 427 R Axis:   -16 Text Interpretation: Sinus tachycardia Otherwise normal ECG No significant change since prior 3/13 Confirmed by Meridee Score [phone]) on 12/21/2019 12:29:56 PM   Radiology No results found.  Procedures .Critical Care Performed by: Terrilee Files, MD Authorized by: Terrilee Files, MD   Critical care provider statement:  Critical care time (minutes):  45   Critical care time was exclusive of:  Separately billable procedures and treating other patients   Critical care was necessary to treat or prevent imminent or life-threatening deterioration of the following conditions:  Circulatory failure   Critical care was time spent personally by me on the following activities:  Discussions with consultants, evaluation of patient's response to treatment, examination of patient, ordering and performing treatments and interventions, ordering and review of laboratory studies, ordering and review of radiographic studies, pulse oximetry, re-evaluation of patient's condition, obtaining history from patient or surrogate, review of old charts and development of treatment plan with patient or surrogate   I assumed direction of critical care for this patient from another provider in my specialty: no     (including critical care time)  Medications Ordered in ED Medications - No data to display  ED Course  I have reviewed the triage vital signs and the nursing notes.  Pertinent labs & imaging results that were available during my care of the patient were reviewed by me and considered in my medical decision making (see chart for details).  Clinical Course as of Dec 20 1441  Mon Dec 21, 2019  1413 Discussed with Dr. Candise Che hematology oncology.  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  He is  recommending the patient be transferred to a facility where they can do plasmapheresis.  He said [facility] be ideal but [facility] be [facility] to [facility] if no beds.   [MB]  1421 I updated the patient on his results and the recommendations from the specialist.   [MB]  1440 Discussed with [facility] hematology Dr. Jolyne Loa.  He agrees that the patient needs to be transferred over and will expedite getting him a bed.   [MB]    Clinical Course User Index [MB] Terrilee Files, MD   MDM Rules/Calculators/A&P                     This patient complains of weakness; this involves an extensive number of treatment Options and is a complaint that carries with it a high risk of complications and Morbidity. The differential includes medication reaction, metabolic derangement, anemia, infection.   I ordered, reviewed and interpreted labs, which included CBC showing lower hemoglobin extremely low platelets of 10,000.  Also showing elevated total bili.  CBC also showing schistocytes and spherocytes. I ordered medicat Previous records obtained and reviewed in epic I consulted hematology Dr. Elias Else [facility] and discussed lab and imaging findings  Critical Interventions: Recognition of thrombocytopenia  After the interventions stated above, I reevaluated the patient and found to be stable.  Will be transferred to [facility] when bed available.  Updated on plan.   Final Clinical Impression(s) / ED Diagnoses Final diagnoses:  Thrombocytopenia (HCC)  Fatigue, unspecified type    Rx / DC Orders ED Discharge Orders    None       Terrilee Files, MD 12/21/19 1447

## 2019-12-21 NOTE — ED Notes (Signed)
Attempted to call report to [facility] RN, left name/call back number with sec

## 2019-12-21 NOTE — ED Notes (Signed)
Pt on monitor 

## 2019-12-22 LAB — URINE CULTURE: Culture: <10000 — AB

## 2019-12-22 LAB — PATHOLOGIST SMEAR REVIEW

## 2019-12-24 MED ORDER — FOLIC ACID 1 MG PO TABS
1.00 | ORAL_TABLET | ORAL | Status: DC
Start: 2019-12-30 — End: 2019-12-24

## 2019-12-24 MED ORDER — HYDROCORTISONE NA SUCCINATE PF 100 MG IJ SOLR
40.00 | INTRAMUSCULAR | Status: DC
Start: ? — End: 2019-12-24

## 2019-12-24 MED ORDER — PANTOPRAZOLE SODIUM 40 MG PO TBEC
40.00 | DELAYED_RELEASE_TABLET | ORAL | Status: DC
Start: 2019-12-31 — End: 2019-12-24

## 2019-12-24 MED ORDER — POLYETHYLENE GLYCOL 3350 17 GM/SCOOP PO POWD
17.00 | ORAL | Status: DC
Start: 2019-12-31 — End: 2019-12-24

## 2019-12-24 MED ORDER — ACETAMINOPHEN 325 MG PO TABS
650.00 | ORAL_TABLET | ORAL | Status: DC
Start: ? — End: 2019-12-24

## 2019-12-24 MED ORDER — ACD-A NOCLOT-50 0.73-2.45-2.2 GM/100ML VI SOLN
1000.00 | Status: DC
Start: ? — End: 2019-12-24

## 2019-12-24 MED ORDER — SENNOSIDES 8.6 MG PO TABS
17.20 | ORAL_TABLET | ORAL | Status: DC
Start: 2019-12-30 — End: 2019-12-24

## 2019-12-24 MED ORDER — CALCIUM GLUCONATE 10 % IV SOLN
1.00 | INTRAVENOUS | Status: DC
Start: ? — End: 2019-12-24

## 2019-12-24 MED ORDER — ONDANSETRON HCL 8 MG PO TABS
8.00 | ORAL_TABLET | ORAL | Status: DC
Start: ? — End: 2019-12-24

## 2019-12-24 MED ORDER — GENERIC EXTERNAL MEDICATION
130.00 | Status: DC
Start: 2019-12-31 — End: 2019-12-24

## 2019-12-24 MED ORDER — CAPLACIZUMAB-YHDP 11 MG IJ KIT
11.00 | PACK | INTRAMUSCULAR | Status: DC
Start: 2019-12-27 — End: 2019-12-24

## 2019-12-24 MED ORDER — DIPHENHYDRAMINE HCL 50 MG/ML IJ SOLN
25.00 | INTRAMUSCULAR | Status: DC
Start: ? — End: 2019-12-24

## 2020-01-01 MED ORDER — CAPLACIZUMAB-YHDP 11 MG IJ KIT
11.00 | PACK | INTRAMUSCULAR | Status: DC
Start: 2019-12-31 — End: 2020-01-01

## 2020-01-07 ENCOUNTER — Emergency Department (HOSPITAL_BASED_OUTPATIENT_CLINIC_OR_DEPARTMENT_OTHER)
Admission: EM | Admit: 2020-01-07 | Discharge: 2020-01-07 | Disposition: A | Discharge status: Home / Self Care | Payer: Self-pay | Attending: Emergency Medicine | Admitting: Emergency Medicine

## 2020-01-07 ENCOUNTER — Other Ambulatory Visit: Payer: Self-pay

## 2020-01-07 DIAGNOSIS — R739 Hyperglycemia, unspecified: Secondary | ICD-10-CM | POA: Insufficient documentation

## 2020-01-07 DIAGNOSIS — R350 Frequency of micturition: Secondary | ICD-10-CM | POA: Insufficient documentation

## 2020-01-07 DIAGNOSIS — F17210 Nicotine dependence, cigarettes, uncomplicated: Secondary | ICD-10-CM | POA: Insufficient documentation

## 2020-01-07 DIAGNOSIS — T380X5A Adverse effect of glucocorticoids and synthetic analogues, initial encounter: Secondary | ICD-10-CM | POA: Insufficient documentation

## 2020-01-07 LAB — COMPREHENSIVE METABOLIC PANEL
ALT: 41 U/L (ref 0–44)
AST: 16 U/L (ref 15–41)
Albumin: 4.1 g/dL (ref 3.5–5.0)
Alkaline Phosphatase: 251 U/L — ABNORMAL HIGH (ref 38–126)
Anion gap: 14 (ref 5–15)
BUN: 35 mg/dL — ABNORMAL HIGH (ref 6–20)
CO2: 21 mmol/L — ABNORMAL LOW (ref 22–32)
Calcium: 9.5 mg/dL (ref 8.9–10.3)
Chloride: 94 mmol/L — ABNORMAL LOW (ref 98–111)
Creatinine, Ser: 1.2 mg/dL (ref 0.61–1.24)
GFR calc Af Amer: >60 mL/min (ref >60)
GFR calc non Af Amer: >60 mL/min (ref >60)
Glucose, Bld: 663 mg/dL (ref 70–99)
Potassium: 4.3 mmol/L (ref 3.5–5.1)
Sodium: 129 mmol/L — ABNORMAL LOW (ref 135–145)
Total Bilirubin: 0.7 mg/dL (ref 0.3–1.2)
Total Protein: 7.1 g/dL (ref 6.5–8.1)

## 2020-01-07 LAB — URINALYSIS, ROUTINE W REFLEX MICROSCOPIC
Bilirubin Urine: NEGATIVE
Glucose, UA: >=500 mg/dL — AB
Ketones, ur: NEGATIVE mg/dL
Leukocytes,Ua: NEGATIVE
Nitrite: NEGATIVE
Protein, ur: NEGATIVE mg/dL
Specific Gravity, Urine: 1.01 (ref 1.005–1.030)
pH: 5.5 (ref 5.0–8.0)

## 2020-01-07 LAB — POCT I-STAT EG7
Acid-base deficit: 1 mmol/L (ref 0.0–2.0)
Bicarbonate: 22.1 mmol/L (ref 20.0–28.0)
Calcium, Ion: 1.2 mmol/L (ref 1.15–1.40)
HCT: 39 % (ref 39.0–52.0)
Hemoglobin: 13.3 g/dL (ref 13.0–17.0)
O2 Saturation: 96 %
Patient temperature: 98.3
Potassium: 4.5 mmol/L (ref 3.5–5.1)
Sodium: 128 mmol/L — ABNORMAL LOW (ref 135–145)
TCO2: 23 mmol/L (ref 22–32)
pCO2, Ven: 29.8 mmHg — ABNORMAL LOW (ref 44.0–60.0)
pH, Ven: 7.478 — ABNORMAL HIGH (ref 7.250–7.430)
pO2, Ven: 72 mmHg — ABNORMAL HIGH (ref 32.0–45.0)

## 2020-01-07 LAB — CBG MONITORING, ED
Glucose-Capillary: >600 mg/dL (ref 70–99)
Glucose-Capillary: 493 mg/dL — ABNORMAL HIGH (ref 70–99)
Glucose-Capillary: 329 mg/dL — ABNORMAL HIGH (ref 70–99)

## 2020-01-07 LAB — PROTIME-INR
INR: 1 (ref 0.8–1.2)
Prothrombin Time: 12.5 seconds (ref 11.4–15.2)

## 2020-01-07 LAB — CBC WITH DIFFERENTIAL/PLATELET
Abs Immature Granulocytes: 0.56 10*3/uL — ABNORMAL HIGH (ref 0.00–0.07)
Basophils Absolute: 0 10*3/uL (ref 0.0–0.1)
Basophils Relative: 0 %
Eosinophils Absolute: 0 10*3/uL (ref 0.0–0.5)
Eosinophils Relative: 0 %
HCT: 37 % — ABNORMAL LOW (ref 39.0–52.0)
Hemoglobin: 12.1 g/dL — ABNORMAL LOW (ref 13.0–17.0)
Immature Granulocytes: 3 %
Lymphocytes Relative: 5 %
Lymphs Abs: 1 10*3/uL (ref 0.7–4.0)
MCH: 30.5 pg (ref 26.0–34.0)
MCHC: 32.7 g/dL (ref 30.0–36.0)
MCV: 93.2 fL (ref 80.0–100.0)
Monocytes Absolute: 0.5 10*3/uL (ref 0.1–1.0)
Monocytes Relative: 2 %
Neutro Abs: 17.7 10*3/uL — ABNORMAL HIGH (ref 1.7–7.7)
Neutrophils Relative %: 90 %
Platelets: 413 10*3/uL — ABNORMAL HIGH (ref 150–400)
RBC: 3.97 MIL/uL — ABNORMAL LOW (ref 4.22–5.81)
RDW: 18.4 % — ABNORMAL HIGH (ref 11.5–15.5)
WBC: 19.7 10*3/uL — ABNORMAL HIGH (ref 4.0–10.5)
nRBC: 0 % (ref 0.0–0.2)

## 2020-01-07 LAB — URINALYSIS, MICROSCOPIC (REFLEX)

## 2020-01-07 LAB — RETICULOCYTES
Immature Retic Fract: 24.5 % — ABNORMAL HIGH (ref 2.3–15.9)
RBC.: 3.9 MIL/uL — ABNORMAL LOW (ref 4.22–5.81)
Retic Count, Absolute: 174.3 10*3/uL (ref 19.0–186.0)
Retic Ct Pct: 4.5 % — ABNORMAL HIGH (ref 0.4–3.1)

## 2020-01-07 LAB — HEMOGLOBIN A1C
Hgb A1c MFr Bld: 6.2 % — ABNORMAL HIGH (ref 4.8–5.6)
Mean Plasma Glucose: 131.24 mg/dL

## 2020-01-07 LAB — LACTATE DEHYDROGENASE: LDH: 212 U/L — ABNORMAL HIGH (ref 98–192)

## 2020-01-07 LAB — LACTIC ACID, PLASMA
Lactic Acid, Venous: 2.8 mmol/L (ref 0.5–1.9)
Lactic Acid, Venous: 2.4 mmol/L (ref 0.5–1.9)

## 2020-01-07 MED ORDER — BLOOD GLUCOSE MONITOR KIT: PACK | 0 refills | Status: AC

## 2020-01-07 MED ORDER — INSULIN ASPART 100 UNIT/ML ~~LOC~~ SOLN
10.0000 [IU] | Freq: Once | SUBCUTANEOUS | Status: AC
  Administered 2020-01-07: 10 [IU] via SUBCUTANEOUS
  Filled 2020-01-07: qty 1

## 2020-01-07 MED ORDER — INSULIN REGULAR HUMAN 100 UNIT/ML IJ SOLN
10.0000 [IU] | Freq: Once | INTRAMUSCULAR | Status: DC
  Filled 2020-01-07: qty 3

## 2020-01-07 MED ORDER — SODIUM CHLORIDE 0.9 % IV BOLUS
1000.0000 mL | Freq: Once | INTRAVENOUS | Status: AC
  Administered 2020-01-07: 1000 mL via INTRAVENOUS

## 2020-01-07 MED ORDER — INSULIN REGULAR HUMAN 100 UNIT/ML IJ SOLN
5.0000 [IU] | Freq: Once | INTRAMUSCULAR | Status: DC
  Filled 2020-01-07: qty 3

## 2020-01-07 MED ORDER — ACETAMINOPHEN 500 MG PO TABS
1000.0000 mg | ORAL_TABLET | Freq: Once | ORAL | Status: AC
  Administered 2020-01-07: 18:00:00 1000 mg via ORAL
  Filled 2020-01-07: qty 2

## 2020-01-07 MED ORDER — INSULIN ASPART 100 UNIT/ML ~~LOC~~ SOLN: 5.0000 [IU] | Freq: Once | SUBCUTANEOUS | Status: DC

## 2020-01-07 MED ORDER — METFORMIN HCL 500 MG PO TABS
500.0000 mg | ORAL_TABLET | Freq: Two times a day (BID) | ORAL | 0 refills | Status: AC
Start: 2020-01-07 — End: 2020-02-06

## 2020-01-07 MED ORDER — INSULIN ASPART 100 UNIT/ML ~~LOC~~ SOLN
10.0000 [IU] | Freq: Once | SUBCUTANEOUS | Status: AC
  Administered 2020-01-07: 10 [IU] via INTRAVENOUS
  Filled 2020-01-07: qty 1

## 2020-01-07 NOTE — ED Provider Notes (Signed)
[facility] Provider Note   CSN: [id number] Arrival date & time: 01/07/20  1139     History Chief Complaint  Patient presents with   Blurred Vision    Willie Cordova is a 44 y.o. male.  Presented to the emergency room with chief complaint of frequent urination, slight blurriness in his vision.  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  Currently on high-dose prednisone.  Over the last few days, patient has noted increased thirst, increased urination as well as some slight blurriness in his vision.  States he can still see fairly well with his glasses on but when he takes his glasses off he feels like his vision is blurrier than normal.  No difference between left and right eye, no pain, no numbness, weakness, speech changes.  No burning when he pees.  States he otherwise has been feeling well, marked improvement from when he was in the hospital and initially presented to ER.  Completed chart review, review of care everywhere, [facility] discharge summary, TTP requiring plasmapheresis, was profoundly anemic, thrombocytopenic. All counts improving at time of dc. No hyperglycemia at hospital discharge.   Recent labs on 5/3 show Hgb 11, platelets nl, WBC 26, glu 414  HPI     No past medical history on file.  There are no problems to display for this patient.   Past Surgical History:  Procedure Laterality Date   ANKLE ARTHROPLASTY         No family history on file.  Social History   Tobacco Use   Smoking status: Current Every Day Smoker    Years: 2.00    Types: Cigars   Smokeless tobacco: Never Used  Substance Use Topics   Alcohol use: Not Currently    Comment: rarely   Drug use: No    Home Medications Prior to Admission medications   Medication Sig Start Date End Date Taking? Authorizing Provider  hydrochlorothiazide (HYDRODIURIL) 12.5 MG tablet Take 2 tablets (25 mg total) by mouth daily. 12/12/18   Hayden Rasmussen, MD    Allergies    Patient has no known allergies.  Review of Systems   Review of Systems  Constitutional: Negative for chills and fever.  HENT: Negative for ear pain and sore throat.   Eyes: Positive for visual disturbance. Negative for pain.  Respiratory: Negative for cough and shortness of breath.   Cardiovascular: Negative for chest pain and palpitations.  Gastrointestinal: Negative for abdominal pain and vomiting.  Genitourinary: Positive for frequency. Negative for dysuria and hematuria.  Musculoskeletal: Negative for arthralgias and back pain.  Skin: Negative for color change and rash.  Neurological: Negative for seizures and syncope.  All other systems reviewed and are negative.   Physical Exam Updated Vital Signs BP (!) 140/92 (BP Location: Right Arm)    Pulse 66    Temp 98.3 F (36.8 C) (Oral)    Resp 14    SpO2 98%   Physical Exam Vitals and nursing note reviewed.  Constitutional:      Appearance: He is well-developed.  HENT:     Head: Normocephalic and atraumatic.  Eyes:     Conjunctiva/sclera: Conjunctivae normal.  Cardiovascular:     Rate and Rhythm: Normal rate and regular rhythm.     Heart sounds: No murmur.  Pulmonary:     Effort: Pulmonary effort is normal. No respiratory distress.     Breath sounds: Normal breath sounds.  Abdominal:     Palpations: Abdomen is  soft.     Tenderness: There is no abdominal tenderness.  Musculoskeletal:     Cervical back: Neck supple.  Skin:    General: Skin is warm and dry.  Neurological:     General: No focal deficit present.     Mental Status: He is alert and oriented to person, place, and time.     Comments: Visual fields intact vision grossly intact  Psychiatric:        Mood and Affect: Mood normal.        Behavior: Behavior normal.     ED Results / Procedures / Treatments   Labs (all labs ordered are listed, but only abnormal results are displayed) Labs Reviewed  CBC WITH  DIFFERENTIAL/PLATELET - Abnormal; Notable for the following components:      Result Value   WBC 19.7 (*)    RBC 3.97 (*)    Hemoglobin 12.1 (*)    HCT 37.0 (*)    RDW 18.4 (*)    Platelets 413 (*)    Neutro Abs 17.7 (*)    Abs Immature Granulocytes 0.56 (*)    All other components within normal limits  COMPREHENSIVE METABOLIC PANEL - Abnormal; Notable for the following components:   Sodium 129 (*)    Chloride 94 (*)    CO2 21 (*)    Glucose, Bld 663 (*)    BUN 35 (*)    Alkaline Phosphatase 251 (*)    All other components within normal limits  URINALYSIS, ROUTINE W REFLEX MICROSCOPIC - Abnormal; Notable for the following components:   Glucose, UA >=500 (*)    Hgb urine dipstick TRACE (*)    All other components within normal limits  RETICULOCYTES - Abnormal; Notable for the following components:   Retic Ct Pct 4.5 (*)    RBC. 3.90 (*)    Immature Retic Fract 24.5 (*)    All other components within normal limits  LACTIC ACID, PLASMA - Abnormal; Notable for the following components:   Lactic Acid, Venous 2.8 (*)    All other components within normal limits  URINALYSIS, MICROSCOPIC (REFLEX) - Abnormal; Notable for the following components:   Bacteria, UA FEW (*)    All other components within normal limits  CBG MONITORING, ED - Abnormal; Notable for the following components:   Glucose-Capillary >600 (*)    All other components within normal limits  POCT I-STAT EG7 - Abnormal; Notable for the following components:   pH, Ven 7.478 (*)    pCO2, Ven 29.8 (*)    pO2, Ven 72.0 (*)    Sodium 128 (*)    All other components within normal limits  PROTIME-INR  ADAMTS13 ACTIVITY  HEMOGLOBIN A1C  LACTIC ACID, PLASMA  LACTATE DEHYDROGENASE  I-STAT VENOUS BLOOD GAS, ED    EKG None  Radiology No results found.  Procedures Procedures (including critical care time)  Medications Ordered in ED Medications  sodium chloride 0.9 % bolus 1,000 mL (has no administration in time  range)    ED Course  I have reviewed the triage vital signs and the nursing notes.  Pertinent labs & imaging results that were available during my care of the patient were reviewed by me and considered in my medical decision making (see chart for details).  Clinical Course as of Jan 06 1337  Thu Jan 07, 2020  1301 Platelets(!): 413 [RD]  1302 Hemoglobin(!): 12.1 [RD]    Clinical Course User Index [RD] Milagros Loll, MD   MDM Rules/Calculators/A&P  44 year old male recent complex hospitalization for TTP discharged on high-dose prednisone presenting to ER with frequent urination, increased thirst as well as some mild generalized blurred vision.  Suspect all symptoms are related to hyperglycemia secondary to steroid use.  His blood counts including hemoglobin levels, platelets continue to improve from hospitalization.  Reviewed case in detail with his primary hematologist, Dr. Jolyne Loa at [facility].  He reports that he has had very similar case of hyperglycemia from high-dose steroid use for TTP that resolved after steroid was finished.  Does not want to make any changes in patient's steroid regimen at this time.  Discussed treatment options, for now recommends Metformin.  Provided patient fluids, insulin, had some improvement in his sugars however still fairly hyperglycemic.  Will give additional fluids and insulin.  While awaiting recheck, patient signed out to Dr. Juleen China.  Assume glucose trends down, given overall clinical picture and patient is well-appearing, believe he can be discharged home.  Has primary doctor appointment Monday, lab recheck with [facility] hematology next week.   Final Clinical Impression(s) / ED Diagnoses Final diagnoses:  Hyperglycemia  Adverse effect of corticosteroids, initial encounter    Rx / DC Orders ED Discharge Orders    None       Milagros Loll, MD 01/07/20 1546

## 2020-01-07 NOTE — ED Notes (Signed)
Date and time results received: 01/07/20 1317  (use smartphrase ".now" to insert current time)  Test: glucose Critical Value: 663  Name of Provider Notified: Dykstra Orders Received? Or Actions Taken?: no orders given

## 2020-01-07 NOTE — ED Triage Notes (Signed)
Pt c/o blurred vision, frequent urination x 1 week. 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

## 2020-01-07 NOTE — Discharge Instructions (Signed)
Recommend checking your glucose in morning and evening.  Take Metformin as prescribed.  Keep appointment with primary doctor on Monday.  If your sugars are consistently running higher than 400, return to ER for reassessment.  If you develop nausea, vomiting, weakness, fever, return to ER for reassessment.

## 2020-01-07 NOTE — ED Notes (Signed)
Date and time results received: 01/07/20 1312 (use smartphrase ".now" to insert current time)  Test: lactic acid Critical Value: 2.8 Name of Provider Notified: Dykstra  Orders Received? Or Actions Taken?: no orders given

## 2020-01-08 ENCOUNTER — Encounter (HOSPITAL_BASED_OUTPATIENT_CLINIC_OR_DEPARTMENT_OTHER): Payer: Self-pay

## 2020-01-08 ENCOUNTER — Other Ambulatory Visit: Payer: Self-pay

## 2020-01-08 ENCOUNTER — Emergency Department (HOSPITAL_BASED_OUTPATIENT_CLINIC_OR_DEPARTMENT_OTHER)
Admission: EM | Admit: 2020-01-08 | Discharge: 2020-01-08 | Disposition: A | Discharge status: Home / Self Care | Payer: Self-pay | Attending: Emergency Medicine | Admitting: Emergency Medicine

## 2020-01-08 DIAGNOSIS — Z96669 Presence of unspecified artificial ankle joint: Secondary | ICD-10-CM | POA: Insufficient documentation

## 2020-01-08 DIAGNOSIS — Z79899 Other long term (current) drug therapy: Secondary | ICD-10-CM | POA: Insufficient documentation

## 2020-01-08 DIAGNOSIS — F17290 Nicotine dependence, other tobacco product, uncomplicated: Secondary | ICD-10-CM | POA: Insufficient documentation

## 2020-01-08 DIAGNOSIS — R739 Hyperglycemia, unspecified: Secondary | ICD-10-CM | POA: Insufficient documentation

## 2020-01-08 HISTORY — DX: Other thrombotic microangiopathy: M31.19

## 2020-01-08 LAB — URINALYSIS, ROUTINE W REFLEX MICROSCOPIC
Bilirubin Urine: NEGATIVE
Glucose, UA: >=500 mg/dL — AB
Hgb urine dipstick: NEGATIVE
Ketones, ur: NEGATIVE mg/dL
Leukocytes,Ua: NEGATIVE
Nitrite: NEGATIVE
Protein, ur: NEGATIVE mg/dL
Specific Gravity, Urine: 1.01 (ref 1.005–1.030)
pH: 6 (ref 5.0–8.0)

## 2020-01-08 LAB — BASIC METABOLIC PANEL
Anion gap: 12 (ref 5–15)
BUN: 31 mg/dL — ABNORMAL HIGH (ref 6–20)
CO2: 20 mmol/L — ABNORMAL LOW (ref 22–32)
Calcium: 8.4 mg/dL — ABNORMAL LOW (ref 8.9–10.3)
Chloride: 96 mmol/L — ABNORMAL LOW (ref 98–111)
Creatinine, Ser: 1.17 mg/dL (ref 0.61–1.24)
GFR calc Af Amer: >60 mL/min (ref >60)
GFR calc non Af Amer: >60 mL/min (ref >60)
Glucose, Bld: 604 mg/dL (ref 70–99)
Potassium: 4.3 mmol/L (ref 3.5–5.1)
Sodium: 128 mmol/L — ABNORMAL LOW (ref 135–145)

## 2020-01-08 LAB — CBC WITH DIFFERENTIAL/PLATELET
Abs Immature Granulocytes: 0.23 10*3/uL — ABNORMAL HIGH (ref 0.00–0.07)
Basophils Absolute: 0 10*3/uL (ref 0.0–0.1)
Basophils Relative: 0 %
Eosinophils Absolute: 0 10*3/uL (ref 0.0–0.5)
Eosinophils Relative: 0 %
HCT: 36.4 % — ABNORMAL LOW (ref 39.0–52.0)
Hemoglobin: 11.7 g/dL — ABNORMAL LOW (ref 13.0–17.0)
Immature Granulocytes: 2 %
Lymphocytes Relative: 5 %
Lymphs Abs: 0.6 10*3/uL — ABNORMAL LOW (ref 0.7–4.0)
MCH: 30.7 pg (ref 26.0–34.0)
MCHC: 32.1 g/dL (ref 30.0–36.0)
MCV: 95.5 fL (ref 80.0–100.0)
Monocytes Absolute: 0.2 10*3/uL (ref 0.1–1.0)
Monocytes Relative: 1 %
Neutro Abs: 12.8 10*3/uL — ABNORMAL HIGH (ref 1.7–7.7)
Neutrophils Relative %: 92 %
Platelets: 313 10*3/uL (ref 150–400)
RBC: 3.81 MIL/uL — ABNORMAL LOW (ref 4.22–5.81)
RDW: 17.9 % — ABNORMAL HIGH (ref 11.5–15.5)
WBC: 13.9 10*3/uL — ABNORMAL HIGH (ref 4.0–10.5)
nRBC: 0 % (ref 0.0–0.2)

## 2020-01-08 LAB — URINALYSIS, MICROSCOPIC (REFLEX): RBC / HPF: NONE SEEN RBC/hpf (ref 0–5)

## 2020-01-08 LAB — ADAMTS13 ACTIVITY

## 2020-01-08 LAB — CBG MONITORING, ED
Glucose-Capillary: >600 mg/dL (ref 70–99)
Glucose-Capillary: 468 mg/dL — ABNORMAL HIGH (ref 70–99)
Glucose-Capillary: 457 mg/dL — ABNORMAL HIGH (ref 70–99)
Glucose-Capillary: 325 mg/dL — ABNORMAL HIGH (ref 70–99)

## 2020-01-08 MED ORDER — INSULIN ASPART 100 UNIT/ML IV SOLN
10.0000 [IU] | Freq: Once | INTRAVENOUS | Status: DC
  Filled 2020-01-08: qty 0.1

## 2020-01-08 MED ORDER — SODIUM CHLORIDE 0.9 % IV BOLUS
1000.0000 mL | Freq: Once | INTRAVENOUS | Status: AC
  Administered 2020-01-08: 20:00:00 1000 mL via INTRAVENOUS

## 2020-01-08 MED ORDER — GLIPIZIDE 5 MG PO TABS: 2.5000 mg | ORAL_TABLET | Freq: Every day | ORAL | 0 refills | Status: AC

## 2020-01-08 MED ORDER — INSULIN ASPART 100 UNIT/ML ~~LOC~~ SOLN
10.0000 [IU] | Freq: Once | SUBCUTANEOUS | Status: AC
  Administered 2020-01-08: 10 [IU] via SUBCUTANEOUS
  Filled 2020-01-08: qty 1

## 2020-01-08 MED ORDER — INSULIN ASPART 100 UNIT/ML ~~LOC~~ SOLN
10.0000 [IU] | Freq: Once | SUBCUTANEOUS | Status: AC
  Administered 2020-01-08: 10 [IU] via INTRAVENOUS
  Filled 2020-01-08: qty 1

## 2020-01-08 MED ORDER — GLIPIZIDE 5 MG PO TABS
2.5000 mg | ORAL_TABLET | Freq: Every day | ORAL | 0 refills | Status: DC
Start: 2020-01-08 — End: 2020-01-08

## 2020-01-08 MED ORDER — LACTATED RINGERS IV BOLUS
2000.0000 mL | Freq: Once | INTRAVENOUS | Status: AC
  Administered 2020-01-08: 2000 mL via INTRAVENOUS

## 2020-01-08 MED FILL — glipiZIDE 5 MG TABS: 5 | 30 days supply | Qty: 15 | Fill #0

## 2020-01-08 NOTE — ED Notes (Signed)
CBG reading HI in triage  

## 2020-01-08 NOTE — ED Notes (Signed)
ED Provider at bedside. 

## 2020-01-08 NOTE — Discharge Instructions (Signed)
Take your glipizide 30 minutes before you eat breakfast.  Continue to take the Metformin.  Return for worsening hyperglycemia.

## 2020-01-08 NOTE — ED Provider Notes (Signed)
[place] EMERGENCY DEPARTMENT Provider Note   CSN: [id number] Arrival date & time: 01/08/20  1537     History Chief Complaint  Patient presents with  . Hyperglycemia    Willie Cordova is a 44 y.o. male.  44 yo M with a chief complaints of hyperglycemia.  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  It was thought that this was activated by getting the [employer] vaccination.  He was seen here yesterday with the same had a blood sugar in the 500s had improvements with fluids and insulin and was sent out on Metformin.  He checked his blood sugar again today and noticed that it was again in the 500s.  Per his hematologist he was to come to the ED if it went over 500.  He was feeling much worse yesterday was having much more frequent urine output and weakness.  Now feels better and has had decreased urine output.  Had some blurry vision yesterday that is resolved.  Denies chest pain denies fever denies abdominal pain nausea or vomiting.  The history is provided by the patient.  Hyperglycemia Blood sugar level PTA:  600 Severity:  Moderate Onset quality:  Gradual Duration:  1 day Timing:  Constant Progression:  Worsening Chronicity:  Recurrent Diabetes status:  Non-diabetic Current diabetic therapy:  Metformin Time since last antidiabetic medication:  6 hours Relieved by:  Nothing Ineffective treatments:  None tried Associated symptoms: no abdominal pain, no chest pain, no confusion, no fever, no shortness of breath and no vomiting        Past Medical History:  Diagnosis Date  . TTP (thrombotic thrombocytopenic purpura) (HCC)     There are no problems to display for this patient.   Past Surgical History:  Procedure Laterality Date  . ANKLE ARTHROPLASTY         No family history on file.  Social History   Tobacco Use  . Smoking status: Current Every Day Smoker    Years: 2.00    Types: Cigars  . Smokeless  tobacco: Never Used  Substance Use Topics  . Alcohol use: Yes    Comment: rarely  . Drug use: No    Home Medications Prior to Admission medications   Medication Sig Start Date End Date Taking? Authorizing Provider  blood glucose meter kit and supplies KIT Dispense based on patient and insurance preference. Use up to four times daily as directed. (FOR ICD-9 250.00, 250.01). 01/07/20   Lucrezia Starch, MD  glipiZIDE (GLUCOTROL) 5 MG tablet Take 0.5 tablets (2.5 mg total) by mouth daily before breakfast. 01/08/20   Deno Etienne, DO  hydrochlorothiazide (HYDRODIURIL) 12.5 MG tablet Take 2 tablets (25 mg total) by mouth daily. 12/12/18   Hayden Rasmussen, MD  metFORMIN (GLUCOPHAGE) 500 MG tablet Take 1 tablet (500 mg total) by mouth 2 (two) times daily with a meal. 01/07/20 02/06/20  Lucrezia Starch, MD    Allergies    Patient has no known allergies.  Review of Systems   Review of Systems  Constitutional: Negative for chills and fever.  HENT: Negative for congestion and facial swelling.   Eyes: Negative for discharge and visual disturbance.  Respiratory: Negative for shortness of breath.   Cardiovascular: Negative for chest pain and palpitations.  Gastrointestinal: Negative for abdominal pain, diarrhea and vomiting.  Musculoskeletal: Negative for arthralgias and myalgias.  Skin: Negative for color change and rash.  Neurological: Negative for tremors, syncope and headaches.  Psychiatric/Behavioral: Negative  for confusion and dysphoric mood.    Physical Exam Updated Vital Signs BP (!) 146/87   Pulse 60   Temp 98.5 F (36.9 C) (Oral)   Resp 16   Ht 6' (1.829 m)   Wt 128.8 kg   SpO2 100%   BMI 38.52 kg/m   Physical Exam Vitals and nursing note reviewed.  Constitutional:      Appearance: He is well-developed.  HENT:     Head: Normocephalic and atraumatic.  Eyes:     Pupils: Pupils are equal, round, and reactive to light.  Neck:     Vascular: No JVD.  Cardiovascular:      Rate and Rhythm: Normal rate and regular rhythm.     Heart sounds: No murmur. No friction rub. No gallop.   Pulmonary:     Effort: No respiratory distress.     Breath sounds: No wheezing.  Abdominal:     General: There is no distension.     Tenderness: There is no abdominal tenderness. There is no guarding or rebound.  Musculoskeletal:        General: Normal range of motion.     Cervical back: Normal range of motion and neck supple.  Skin:    Coloration: Skin is not pale.     Findings: No rash.  Neurological:     Mental Status: He is alert and oriented to person, place, and time.  Psychiatric:        Behavior: Behavior normal.     ED Results / Procedures / Treatments   Labs (all labs ordered are listed, but only abnormal results are displayed) Labs Reviewed  CBC WITH DIFFERENTIAL/PLATELET - Abnormal; Notable for the following components:      Result Value   WBC 13.9 (*)    RBC 3.81 (*)    Hemoglobin 11.7 (*)    HCT 36.4 (*)    RDW 17.9 (*)    Neutro Abs 12.8 (*)    Lymphs Abs 0.6 (*)    Abs Immature Granulocytes 0.23 (*)    All other components within normal limits  BASIC METABOLIC PANEL - Abnormal; Notable for the following components:   Sodium 128 (*)    Chloride 96 (*)    CO2 20 (*)    Glucose, Bld 604 (*)    BUN 31 (*)    Calcium 8.4 (*)    All other components within normal limits  URINALYSIS, ROUTINE W REFLEX MICROSCOPIC - Abnormal; Notable for the following components:   Glucose, UA >=500 (*)    All other components within normal limits  URINALYSIS, MICROSCOPIC (REFLEX) - Abnormal; Notable for the following components:   Bacteria, UA RARE (*)    All other components within normal limits  CBG MONITORING, ED - Abnormal; Notable for the following components:   Glucose-Capillary >600 (*)    All other components within normal limits  CBG MONITORING, ED - Abnormal; Notable for the following components:   Glucose-Capillary 468 (*)    All other components within  normal limits  CBG MONITORING, ED - Abnormal; Notable for the following components:   Glucose-Capillary 457 (*)    All other components within normal limits  CBG MONITORING, ED - Abnormal; Notable for the following components:   Glucose-Capillary 325 (*)    All other components within normal limits    EKG None  Radiology No results found.  Procedures Procedures (including critical care time)  Medications Ordered in ED Medications  insulin aspart (novoLOG) injection 10 Units (has  no administration in time range)  lactated ringers bolus 2,000 mL (0 mLs Intravenous Stopped 01/08/20 1822)  insulin aspart (novoLOG) injection 10 Units (10 Units Subcutaneous Given 01/08/20 1821)  insulin aspart (novoLOG) injection 10 Units (10 Units Intravenous Given 01/08/20 2006)  sodium chloride 0.9 % bolus 1,000 mL (0 mLs Intravenous Stopped 01/08/20 2131)    ED Course  I have reviewed the triage vital signs and the nursing notes.  Pertinent labs & imaging results that were available during my care of the patient were reviewed by me and considered in my medical decision making (see chart for details).    MDM Rules/Calculators/A&P                      44 yo M with a chief complaints of hyperglycemia.  He was seen here yesterday for the same.  Not DKA then had improvement of his blood sugar and sent home.  Case was discussed with his hematologist yesterday and the decision to start him on Metformin was made.  Despite taking his Metformin the patient's blood sugar again is over 600.  Will attempt to reach out to his hematologist.  Recheck lab work here to assess for DKA or HHS.  Give IV fluids.  Lab work without DKA.  Looks very similar to yesterday.  Blood sugar of 600 with mild acidosis no anion gap.  I discussed the case with Dr. Sherral Hammers, hematology.  Would like to add something to his diabetes regimen.  He is cautious to add insulin because the patient should be tapering soon down from his steroids.  Will  start on glipizide.  They will call him tomorrow.  Patient with very modest improvement after the initial 10 unit bolus of insulin.  Given another liter of fluids and 10 of insulin now down into the low 300s.  We will give 1 more bolus and discharge home have him follow-up tomorrow.  9:32 PM:  I have discussed the diagnosis/risks/treatment options with the patient and believe the pt to be eligible for discharge home to follow-up with PCP. We also discussed returning to the ED immediately if new or worsening sx occur. We discussed the sx which are most concerning (e.g., sudden worsening pain, fever, inability to tolerate by mouth) that necessitate immediate return. Medications administered to the patient during their visit and any new prescriptions provided to the patient are listed below.  Medications given during this visit Medications  insulin aspart (novoLOG) injection 10 Units (has no administration in time range)  lactated ringers bolus 2,000 mL (0 mLs Intravenous Stopped 01/08/20 1822)  insulin aspart (novoLOG) injection 10 Units (10 Units Subcutaneous Given 01/08/20 1821)  insulin aspart (novoLOG) injection 10 Units (10 Units Intravenous Given 01/08/20 2006)  sodium chloride 0.9 % bolus 1,000 mL (0 mLs Intravenous Stopped 01/08/20 2131)     The patient appears reasonably screen and/or stabilized for discharge and I doubt any other medical condition or other [facility] requiring further screening, evaluation, or treatment in the ED at this time prior to discharge.   Final Clinical Impression(s) / ED Diagnoses Final diagnoses:  Hyperglycemia    Rx / DC Orders ED Discharge Orders         Ordered    glipiZIDE (GLUCOTROL) 5 MG tablet  Daily before breakfast     01/08/20 Learned, Kaly Mcquary, DO 01/08/20 2132

## 2020-01-08 NOTE — ED Triage Notes (Signed)
Pt c/o elevated BS-seen here for same yesterday-states reading at home was "588"-NAD-steady gait

## 2020-01-12 ENCOUNTER — Other Ambulatory Visit: Payer: Self-pay

## 2020-01-12 ENCOUNTER — Emergency Department (HOSPITAL_BASED_OUTPATIENT_CLINIC_OR_DEPARTMENT_OTHER)
Admission: EM | Admit: 2020-01-12 | Discharge: 2020-01-12 | Disposition: A | Discharge status: Home / Self Care | Payer: Self-pay | Attending: Emergency Medicine | Admitting: Emergency Medicine

## 2020-01-12 ENCOUNTER — Encounter (HOSPITAL_BASED_OUTPATIENT_CLINIC_OR_DEPARTMENT_OTHER): Payer: Self-pay | Admitting: Emergency Medicine

## 2020-01-12 DIAGNOSIS — Z794 Long term (current) use of insulin: Secondary | ICD-10-CM | POA: Insufficient documentation

## 2020-01-12 DIAGNOSIS — E1165 Type 2 diabetes mellitus with hyperglycemia: Secondary | ICD-10-CM | POA: Insufficient documentation

## 2020-01-12 DIAGNOSIS — M311 Thrombotic microangiopathy: Secondary | ICD-10-CM | POA: Insufficient documentation

## 2020-01-12 DIAGNOSIS — R739 Hyperglycemia, unspecified: Secondary | ICD-10-CM

## 2020-01-12 DIAGNOSIS — Z79899 Other long term (current) drug therapy: Secondary | ICD-10-CM | POA: Insufficient documentation

## 2020-01-12 DIAGNOSIS — F17210 Nicotine dependence, cigarettes, uncomplicated: Secondary | ICD-10-CM | POA: Insufficient documentation

## 2020-01-12 DIAGNOSIS — T148XXA Other injury of unspecified body region, initial encounter: Secondary | ICD-10-CM

## 2020-01-12 LAB — COMPREHENSIVE METABOLIC PANEL
ALT: 37 U/L (ref 0–44)
AST: 17 U/L (ref 15–41)
Albumin: 3.7 g/dL (ref 3.5–5.0)
Alkaline Phosphatase: 187 U/L — ABNORMAL HIGH (ref 38–126)
Anion gap: 10 (ref 5–15)
BUN: 25 mg/dL — ABNORMAL HIGH (ref 6–20)
CO2: 21 mmol/L — ABNORMAL LOW (ref 22–32)
Calcium: 8.8 mg/dL — ABNORMAL LOW (ref 8.9–10.3)
Chloride: 101 mmol/L (ref 98–111)
Creatinine, Ser: 1.07 mg/dL (ref 0.61–1.24)
GFR calc Af Amer: >60 mL/min (ref >60)
GFR calc non Af Amer: >60 mL/min (ref >60)
Glucose, Bld: 422 mg/dL — ABNORMAL HIGH (ref 70–99)
Potassium: 4 mmol/L (ref 3.5–5.1)
Sodium: 132 mmol/L — ABNORMAL LOW (ref 135–145)
Total Bilirubin: 0.5 mg/dL (ref 0.3–1.2)
Total Protein: 6.5 g/dL (ref 6.5–8.1)

## 2020-01-12 LAB — PROTIME-INR
INR: 0.9 (ref 0.8–1.2)
Prothrombin Time: 11.8 seconds (ref 11.4–15.2)

## 2020-01-12 LAB — CBC WITH DIFFERENTIAL/PLATELET
Abs Immature Granulocytes: 0.26 10*3/uL — ABNORMAL HIGH (ref 0.00–0.07)
Basophils Absolute: 0 10*3/uL (ref 0.0–0.1)
Basophils Relative: 0 %
Eosinophils Absolute: 0 10*3/uL (ref 0.0–0.5)
Eosinophils Relative: 0 %
HCT: 35.7 % — ABNORMAL LOW (ref 39.0–52.0)
Hemoglobin: 11.6 g/dL — ABNORMAL LOW (ref 13.0–17.0)
Immature Granulocytes: 2 %
Lymphocytes Relative: 15 %
Lymphs Abs: 2.3 10*3/uL (ref 0.7–4.0)
MCH: 30.5 pg (ref 26.0–34.0)
MCHC: 32.5 g/dL (ref 30.0–36.0)
MCV: 93.9 fL (ref 80.0–100.0)
Monocytes Absolute: 0.8 10*3/uL (ref 0.1–1.0)
Monocytes Relative: 5 %
Neutro Abs: 12.3 10*3/uL — ABNORMAL HIGH (ref 1.7–7.7)
Neutrophils Relative %: 78 %
Platelets: 230 10*3/uL (ref 150–400)
RBC: 3.8 MIL/uL — ABNORMAL LOW (ref 4.22–5.81)
RDW: 17.2 % — ABNORMAL HIGH (ref 11.5–15.5)
WBC: 15.7 10*3/uL — ABNORMAL HIGH (ref 4.0–10.5)
nRBC: 0 % (ref 0.0–0.2)

## 2020-01-12 LAB — CBG MONITORING, ED
Glucose-Capillary: 466 mg/dL — ABNORMAL HIGH (ref 70–99)
Glucose-Capillary: 329 mg/dL — ABNORMAL HIGH (ref 70–99)

## 2020-01-12 MED ORDER — INSULIN ASPART 100 UNIT/ML ~~LOC~~ SOLN
8.0000 [IU] | Freq: Once | SUBCUTANEOUS | Status: AC
  Administered 2020-01-12: 8 [IU] via SUBCUTANEOUS
  Filled 2020-01-12: qty 1

## 2020-01-12 MED ORDER — SODIUM CHLORIDE 0.9 % IV BOLUS
2000.0000 mL | Freq: Once | INTRAVENOUS | Status: AC
  Administered 2020-01-12: 2000 mL via INTRAVENOUS

## 2020-01-12 NOTE — Discharge Instructions (Addendum)
Continue current regimen for diabetes .  Follow-up with your primary care doctor as well as endocrinology.  Also follow-up with your hematologist.  Return to ER if you have blood sugar greater than 600, abdominal pain, worse bruising or bleeding, fever.

## 2020-01-12 NOTE — ED Triage Notes (Signed)
Here for possible High Blood sugar. Seen Recently for the same. PT alert, amb NAD.

## 2020-01-12 NOTE — ED Provider Notes (Signed)
[facility] EMERGENCY DEPARTMENT Provider Note   CSN: [id number] Arrival date & time: 01/12/20  1822     History No chief complaint on file.   Willie Cordova is a 44 y.o. male recent history of TTP here presenting with bruise on the left arm.  Patient states that he received [employer] Covid vaccine several weeks ago and then subsequently developed TTP.  Patient received plasmapheresis and also IV steroids and subsequently became hyperglycemic.  Given the ED several times and is currently on glipizide as well as subcu insulin .  Patient states that for the last several days he noticed a bruise in the left forearm.  He was concerned that he may have TTP again so sent here for evaluation. Patient denies any other bruising or bleeding.  States that his blood sugar sometimes is elevated around 500s.  He denies any fever or vomiting.  The history is provided by the patient.       Past Medical History:  Diagnosis Date  . TTP (thrombotic thrombocytopenic purpura) (HCC)     There are no problems to display for this patient.   Past Surgical History:  Procedure Laterality Date  . ANKLE ARTHROPLASTY         No family history on file.  Social History   Tobacco Use  . Smoking status: Current Every Day Smoker    Years: 2.00    Types: Cigars  . Smokeless tobacco: Never Used  Substance Use Topics  . Alcohol use: Yes    Comment: rarely  . Drug use: No    Home Medications Prior to Admission medications   Medication Sig Start Date End Date Taking? Authorizing Provider  insulin lispro (HUMALOG) 100 UNIT/ML injection Inject 8 Units into the skin 2 (two) times daily before a meal.   Yes [provider]  blood glucose meter kit and supplies KIT Dispense based on patient and insurance preference. Use up to four times daily as directed. (FOR ICD-9 250.00, 250.01). 01/07/20   Lucrezia Starch, MD  glipiZIDE (GLUCOTROL) 5 MG tablet Take 0.5 tablets (2.5 mg  total) by mouth daily before breakfast. 01/08/20   Deno Etienne, DO  hydrochlorothiazide (HYDRODIURIL) 12.5 MG tablet Take 2 tablets (25 mg total) by mouth daily. 12/12/18   Hayden Rasmussen, MD  metFORMIN (GLUCOPHAGE) 500 MG tablet Take 1 tablet (500 mg total) by mouth 2 (two) times daily with a meal. 01/07/20 02/06/20  Lucrezia Starch, MD    Allergies    Patient has no known allergies.  Review of Systems   Review of Systems  Skin: Positive for color change.  All other systems reviewed and are negative.   Physical Exam Updated Vital Signs BP 131/83   Pulse 65   Temp 98.8 F (37.1 C) (Oral)   Resp 16   SpO2 100%   Physical Exam Vitals and nursing note reviewed.  HENT:     Head: Normocephalic.     Nose: Nose normal.     Mouth/Throat:     Mouth: Mucous membranes are moist.  Eyes:     Extraocular Movements: Extraocular movements intact.     Pupils: Pupils are equal, round, and reactive to light.  Cardiovascular:     Rate and Rhythm: Normal rate and regular rhythm.     Pulses: Normal pulses.  Pulmonary:     Effort: Pulmonary effort is normal.     Breath sounds: Normal breath sounds.  Abdominal:     General: Abdomen is  flat.     Palpations: Abdomen is soft.  Musculoskeletal:     Cervical back: Normal range of motion.     Comments: Ecchymosis in the left forearm, no obvious purpura or petechiae.  Skin:    General: Skin is warm.     Capillary Refill: Capillary refill takes less than 2 seconds.  Neurological:     General: No focal deficit present.     Mental Status: He is alert and oriented to person, place, and time.  Psychiatric:        Mood and Affect: Mood normal.        Behavior: Behavior normal.     ED Results / Procedures / Treatments   Labs (all labs ordered are listed, but only abnormal results are displayed) Labs Reviewed  CBC WITH DIFFERENTIAL/PLATELET - Abnormal; Notable for the following components:      Result Value   WBC 15.7 (*)    RBC 3.80 (*)     Hemoglobin 11.6 (*)    HCT 35.7 (*)    RDW 17.2 (*)    Neutro Abs 12.3 (*)    Abs Immature Granulocytes 0.26 (*)    All other components within normal limits  COMPREHENSIVE METABOLIC PANEL - Abnormal; Notable for the following components:   Sodium 132 (*)    CO2 21 (*)    Glucose, Bld 422 (*)    BUN 25 (*)    Calcium 8.8 (*)    Alkaline Phosphatase 187 (*)    All other components within normal limits  CBG MONITORING, ED - Abnormal; Notable for the following components:   Glucose-Capillary 466 (*)    All other components within normal limits  CBG MONITORING, ED - Abnormal; Notable for the following components:   Glucose-Capillary 329 (*)    All other components within normal limits  PROTIME-INR    EKG None  Radiology No results found.  Procedures Procedures (including critical care time)  Medications Ordered in ED Medications  sodium chloride 0.9 % bolus 2,000 mL ( Intravenous Stopped 01/12/20 2020)  insulin aspart (novoLOG) injection 8 Units (8 Units Subcutaneous Given 01/12/20 2023)    ED Course  I have reviewed the triage vital signs and the nursing notes.  Pertinent labs & imaging results that were available during my care of the patient were reviewed by me and considered in my medical decision making (see chart for details).    MDM Rules/Calculators/A&P                      [name] is a 44 y.o. male here presenting with left forearm bruising.  Given recent history of TTP, will get CBC and CMP to rule out TTP.  He is also hyperglycemic at home but has no signs of DKA.  Will check labs and hydrate.   9:58 PM His glucose was 422.  Anion gap is normal.  His platelet count is normal.  Given 2 L bolus insulin and blood sugar went down to 320. Patient has Humalog at home.  He also has PCP follow-up.  He is getting referred to endocrinology.  At this point, patient is not in TTP.  He is stable for discharge   Final Clinical Impression(s) / ED Diagnoses Final  diagnoses:  None    Rx / DC Orders ED Discharge Orders    None       Drenda Freeze, MD 01/12/20 2159

## 2020-01-12 NOTE — ED Triage Notes (Signed)
PT does not have a primary MD. PT also concerned about a bruise on right forearm.

## 2022-12-13 ENCOUNTER — Emergency Department (HOSPITAL_BASED_OUTPATIENT_CLINIC_OR_DEPARTMENT_OTHER)
Admission: EM | Admit: 2022-12-13 | Discharge: 2022-12-13 | Disposition: A | Discharge status: Home / Self Care | Payer: Self-pay | Attending: Emergency Medicine | Admitting: Emergency Medicine

## 2022-12-13 ENCOUNTER — Other Ambulatory Visit: Payer: Self-pay

## 2022-12-13 DIAGNOSIS — Z79899 Other long term (current) drug therapy: Secondary | ICD-10-CM | POA: Insufficient documentation

## 2022-12-13 DIAGNOSIS — I10 Essential (primary) hypertension: Secondary | ICD-10-CM | POA: Insufficient documentation

## 2022-12-13 LAB — CBC WITH DIFFERENTIAL/PLATELET
Abs Immature Granulocytes: 0.05 10*3/uL (ref 0.00–0.07)
Basophils Absolute: 0.1 10*3/uL (ref 0.0–0.1)
Basophils Relative: 1 %
Eosinophils Absolute: 0.1 10*3/uL (ref 0.0–0.5)
Eosinophils Relative: 1 %
HCT: 46.1 % (ref 39.0–52.0)
Hemoglobin: 14.7 g/dL (ref 13.0–17.0)
Immature Granulocytes: 1 %
Lymphocytes Relative: 44 %
Lymphs Abs: 3.5 10*3/uL (ref 0.7–4.0)
MCH: 27.2 pg (ref 26.0–34.0)
MCHC: 31.9 g/dL (ref 30.0–36.0)
MCV: 85.2 fL (ref 80.0–100.0)
Monocytes Absolute: 0.5 10*3/uL (ref 0.1–1.0)
Monocytes Relative: 6 %
Neutro Abs: 3.7 10*3/uL (ref 1.7–7.7)
Neutrophils Relative %: 47 %
Platelets: 287 10*3/uL (ref 150–400)
RBC: 5.41 MIL/uL (ref 4.22–5.81)
RDW: 14.1 % (ref 11.5–15.5)
WBC: 7.9 10*3/uL (ref 4.0–10.5)
nRBC: 0 % (ref 0.0–0.2)

## 2022-12-13 LAB — COMPREHENSIVE METABOLIC PANEL
ALT: 17 U/L (ref 0–44)
AST: 18 U/L (ref 15–41)
Albumin: 4.3 g/dL (ref 3.5–5.0)
Alkaline Phosphatase: 68 U/L (ref 38–126)
Anion gap: 9 (ref 5–15)
BUN: 12 mg/dL (ref 6–20)
CO2: 24 mmol/L (ref 22–32)
Calcium: 9.4 mg/dL (ref 8.9–10.3)
Chloride: 106 mmol/L (ref 98–111)
Creatinine, Ser: 1.01 mg/dL (ref 0.61–1.24)
GFR, Estimated: >60 mL/min (ref >60)
Glucose, Bld: 94 mg/dL (ref 70–99)
Potassium: 4.3 mmol/L (ref 3.5–5.1)
Sodium: 139 mmol/L (ref 135–145)
Total Bilirubin: 0.6 mg/dL (ref 0.3–1.2)
Total Protein: 7.4 g/dL (ref 6.5–8.1)

## 2022-12-13 NOTE — ED Triage Notes (Signed)
Reports checking blood pressure at home with a portable machine the last two days with high readings of 163/96. Endorsed weakness, generalized in nature for a week now. C/O metallic taste on his tongue and some swallowing difficulty a little over a week ago and has some ABX treatment already.

## 2022-12-13 NOTE — Discharge Instructions (Signed)
Follow up with your PCP in the office.

## 2022-12-13 NOTE — ED Provider Notes (Signed)
[place] EMERGENCY DEPARTMENT AT [facility] Provider Note   CSN: [id number] Arrival date & time: 12/13/22  1143     History  Chief Complaint  Patient presents with   Hypertension    Willie Cordova is a 47 y.o. male.  47 yo M with a chief complaint of not feeling well.  Patient has trouble describing it much further.  He feels like he did before he was sick was previously.  He denies specific cough or congestion.  Denies headaches denies abdominal pain denies nausea vomiting or diarrhea.  Denies fevers.  He does feel like he has a metallic taste to his mouth.  He does not think that food taste different.   Hypertension       Home Medications Prior to Admission medications   Medication Sig Start Date End Date Taking? Authorizing Provider  blood glucose meter kit and supplies KIT Dispense based on patient and insurance preference. Use up to four times daily as directed. (FOR ICD-9 250.00, 250.01). 01/07/20   Milagros Loll, MD  glipiZIDE (GLUCOTROL) 5 MG tablet Take 0.5 tablets (2.5 mg total) by mouth daily before breakfast. 01/08/20   Melene Plan, DO  hydrochlorothiazide (HYDRODIURIL) 12.5 MG tablet Take 2 tablets (25 mg total) by mouth daily. 12/12/18   Terrilee Files, MD  insulin lispro (HUMALOG) 100 UNIT/ML injection Inject 8 Units into the skin 2 (two) times daily before a meal.    [provider]  metFORMIN (GLUCOPHAGE) 500 MG tablet Take 1 tablet (500 mg total) by mouth 2 (two) times daily with a meal. 01/07/20 02/06/20  Milagros Loll, MD      Allergies    Patient has no known allergies.    Review of Systems   Review of Systems  Physical Exam Updated Vital Signs BP 124/80   Pulse (!) 57   Temp 98.2 F (36.8 C) (Oral)   Resp 17   Ht 6\' 1"  (1.854 m)   Wt 124.7 kg   SpO2 98%   BMI 36.28 kg/m  Physical Exam Vitals and nursing note reviewed.  Constitutional:      Appearance: He is well-developed.  HENT:     Head: Normocephalic and  atraumatic.     Comments: Swollen turbinates, posterior nasal drip, no noted sinus ttp, tm normal bilaterally.   Eyes:     Pupils: Pupils are equal, round, and reactive to light.  Neck:     Vascular: No JVD.  Cardiovascular:     Rate and Rhythm: Normal rate and regular rhythm.     Heart sounds: No murmur heard.    No friction rub. No gallop.  Pulmonary:     Effort: No respiratory distress.     Breath sounds: No wheezing.  Abdominal:     General: There is no distension.     Tenderness: There is no abdominal tenderness. There is no guarding or rebound.  Musculoskeletal:        General: Normal range of motion.     Cervical back: Normal range of motion and neck supple.  Skin:    Coloration: Skin is not pale.     Findings: No rash.  Neurological:     Mental Status: He is alert and oriented to person, place, and time.  Psychiatric:        Behavior: Behavior normal.     ED Results / Procedures / Treatments   Labs (all labs ordered are listed, but only abnormal results are displayed) Labs Reviewed  CBC WITH DIFFERENTIAL/PLATELET  COMPREHENSIVE METABOLIC PANEL    EKG None  Radiology No results found.  Procedures Procedures    Medications Ordered in ED Medications - No data to display  ED Course/ Medical Decision Making/ A&P                             Medical Decision Making Amount and/or Complexity of Data Reviewed Labs: ordered.   47 yo M with a chief complaint of not feeling well.  He has trouble describing it any further way.  He looks well on exam.  He is describing what could be dysgeusia.  Will obtain blood work to assess renal function.  Otherwise suspect he has a viral URI.  He also was worried about his blood pressure.  Not significantly elevated here.    Blood work without significant change in renal function.  No leukocytosis no anemia.  Will discharge home  PCP follow-up  2:42 PM:  I have discussed the diagnosis/risks/treatment options with the  patient.  Evaluation and diagnostic testing in the emergency department does not suggest an emergent condition requiring admission or immediate intervention beyond what has been performed at this time.  They will follow up with PCP. We also discussed returning to the ED immediately if new or worsening sx occur. We discussed the sx which are most concerning (e.g., sudden worsening pain, fever, inability to tolerate by mouth) that necessitate immediate return. Medications administered to the patient during their visit and any new prescriptions provided to the patient are listed below.  Medications given during this visit Medications - No data to display   The patient appears reasonably screen and/or stabilized for discharge and I doubt any other medical condition or other [facility] requiring further screening, evaluation, or treatment in the ED at this time prior to discharge.          Final Clinical Impression(s) / ED Diagnoses Final diagnoses:  Hypertension, unspecified type    Rx / DC Orders ED Discharge Orders     None         Melene Plan, DO 12/13/22 1442

## 2023-11-02 ENCOUNTER — Emergency Department (HOSPITAL_BASED_OUTPATIENT_CLINIC_OR_DEPARTMENT_OTHER)
Admission: EM | Admit: 2023-11-02 | Discharge: 2023-11-02 | Disposition: A | Discharge status: Home / Self Care | Payer: Self-pay | Attending: Emergency Medicine | Admitting: Emergency Medicine

## 2023-11-02 ENCOUNTER — Other Ambulatory Visit: Payer: Self-pay

## 2023-11-02 ENCOUNTER — Emergency Department (HOSPITAL_BASED_OUTPATIENT_CLINIC_OR_DEPARTMENT_OTHER): Payer: Self-pay

## 2023-11-02 ENCOUNTER — Encounter (HOSPITAL_BASED_OUTPATIENT_CLINIC_OR_DEPARTMENT_OTHER): Payer: Self-pay | Admitting: Emergency Medicine

## 2023-11-02 DIAGNOSIS — R519 Headache, unspecified: Secondary | ICD-10-CM | POA: Insufficient documentation

## 2023-11-02 DIAGNOSIS — R531 Weakness: Secondary | ICD-10-CM | POA: Insufficient documentation

## 2023-11-02 DIAGNOSIS — R202 Paresthesia of skin: Secondary | ICD-10-CM | POA: Insufficient documentation

## 2023-11-02 DIAGNOSIS — R0789 Other chest pain: Secondary | ICD-10-CM | POA: Insufficient documentation

## 2023-11-02 DIAGNOSIS — Z794 Long term (current) use of insulin: Secondary | ICD-10-CM | POA: Insufficient documentation

## 2023-11-02 DIAGNOSIS — R059 Cough, unspecified: Secondary | ICD-10-CM | POA: Insufficient documentation

## 2023-11-02 DIAGNOSIS — R5383 Other fatigue: Secondary | ICD-10-CM | POA: Insufficient documentation

## 2023-11-02 LAB — CBC WITH DIFFERENTIAL/PLATELET
Abs Immature Granulocytes: 0.05 10*3/uL (ref 0.00–0.07)
Basophils Absolute: 0.1 10*3/uL (ref 0.0–0.1)
Basophils Relative: 1 %
Eosinophils Absolute: 0.1 10*3/uL (ref 0.0–0.5)
Eosinophils Relative: 1 %
HCT: 44.6 % (ref 39.0–52.0)
Hemoglobin: 14.4 g/dL (ref 13.0–17.0)
Immature Granulocytes: 1 %
Lymphocytes Relative: 35 %
Lymphs Abs: 3.7 10*3/uL (ref 0.7–4.0)
MCH: 27.2 pg (ref 26.0–34.0)
MCHC: 32.3 g/dL (ref 30.0–36.0)
MCV: 84.2 fL (ref 80.0–100.0)
Monocytes Absolute: 0.8 10*3/uL (ref 0.1–1.0)
Monocytes Relative: 8 %
Neutro Abs: 5.7 10*3/uL (ref 1.7–7.7)
Neutrophils Relative %: 54 %
Platelets: 294 10*3/uL (ref 150–400)
RBC: 5.3 MIL/uL (ref 4.22–5.81)
RDW: 14.6 % (ref 11.5–15.5)
WBC: 10.4 10*3/uL (ref 4.0–10.5)
nRBC: 0 % (ref 0.0–0.2)

## 2023-11-02 LAB — COMPREHENSIVE METABOLIC PANEL
ALT: 15 U/L (ref 0–44)
AST: 14 U/L — ABNORMAL LOW (ref 15–41)
Albumin: 4.1 g/dL (ref 3.5–5.0)
Alkaline Phosphatase: 65 U/L (ref 38–126)
Anion gap: 9 (ref 5–15)
BUN: 10 mg/dL (ref 6–20)
CO2: 22 mmol/L (ref 22–32)
Calcium: 9.2 mg/dL (ref 8.9–10.3)
Chloride: 107 mmol/L (ref 98–111)
Creatinine, Ser: 0.87 mg/dL (ref 0.61–1.24)
GFR, Estimated: >60 mL/min (ref >60)
Glucose, Bld: 84 mg/dL (ref 70–99)
Potassium: 3.9 mmol/L (ref 3.5–5.1)
Sodium: 138 mmol/L (ref 135–145)
Total Bilirubin: 0.7 mg/dL (ref 0.0–1.2)
Total Protein: 7.2 g/dL (ref 6.5–8.1)

## 2023-11-02 LAB — RESP PANEL BY RT-PCR (RSV, FLU A&B, COVID)  RVPGX2
Influenza A by PCR: NEGATIVE
Influenza B by PCR: NEGATIVE
Resp Syncytial Virus by PCR: NEGATIVE
SARS Coronavirus 2 by RT PCR: NEGATIVE

## 2023-11-02 LAB — TROPONIN I (HIGH SENSITIVITY): Troponin I (High Sensitivity): 4 ng/L (ref <18)

## 2023-11-02 MED ORDER — AMOXICILLIN-POT CLAVULANATE 875-125 MG PO TABS: 1.0000 | ORAL_TABLET | Freq: Two times a day (BID) | ORAL | 0 refills | Status: AC

## 2023-11-02 NOTE — Discharge Instructions (Addendum)
 It is unclear what is causing your fatigue symptoms.  Might be from a sinus infection so we are putting on antibiotics.  Otherwise you need to follow-up with your primary care physician.  If you develop continued, recurrent, or worsening headache, fever, neck stiffness, vomiting, blurry or double vision, weakness or numbness in your arms or legs, trouble speaking, or any other new/concerning symptoms then return to the ER for evaluation.

## 2023-11-02 NOTE — ED Triage Notes (Signed)
 Pt c/o fatigue, general weakness and HA x about 2 wks

## 2023-11-02 NOTE — ED Provider Notes (Signed)
 [facility] Provider Note   CSN: [id number] Arrival date & time: 11/02/23  1040     History  Chief Complaint  Patient presents with   Headache    Willie Cordova is a 48 y.o. male.  HPI 48 year old male presents with a chief complaint of fatigue.  He states he had a mild cold with some cough and weakness a few weeks ago.  Cough seems to be better and gone but he still feels general fatigue.  He will get some occasional tingling under his tongue and in his throat.  He has pressure to his face for the past few weeks.  No significant headache, vomiting, focal weakness, shortness of breath.  He does have some chest pressure this been on and off during this time.  No chest pressure currently.  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  Home Medications Prior to Admission medications   Medication Sig Start Date End Date Taking? Authorizing Provider  amoxicillin-clavulanate (AUGMENTIN) 875-125 MG tablet Take 1 tablet by mouth every 12 (twelve) hours. 11/02/23  Yes Pricilla Loveless, MD  blood glucose meter kit and supplies KIT Dispense based on patient and insurance preference. Use up to four times daily as directed. (FOR ICD-9 250.00, 250.01). 01/07/20   Milagros Loll, MD  glipiZIDE (GLUCOTROL) 5 MG tablet Take 0.5 tablets (2.5 mg total) by mouth daily before breakfast. 01/08/20   Melene Plan, DO  hydrochlorothiazide (HYDRODIURIL) 12.5 MG tablet Take 2 tablets (25 mg total) by mouth daily. 12/12/18   Terrilee Files, MD  insulin lispro (HUMALOG) 100 UNIT/ML injection Inject 8 Units into the skin 2 (two) times daily before a meal.    [provider]  metFORMIN (GLUCOPHAGE) 500 MG tablet Take 1 tablet (500 mg total) by mouth 2 (two) times daily with a meal. 01/07/20 02/06/20  Milagros Loll, MD      Allergies    Patient has no known allergies.    Review of Systems   Review of Systems   Constitutional:  Positive for fatigue. Negative for fever.  HENT:  Positive for sinus pressure.   Respiratory:  Positive for cough. Negative for shortness of breath.   Cardiovascular:  Positive for chest pain.  Gastrointestinal:  Negative for abdominal pain and vomiting.    Physical Exam Updated Vital Signs BP 120/85   Pulse 79   Temp 99.6 F (37.6 C)   Resp 19   Ht 6\' 1"  (1.854 m)   Wt 127 kg   SpO2 96%   BMI 36.94 kg/m  Physical Exam Vitals and nursing note reviewed.  Constitutional:      General: He is not in acute distress.    Appearance: He is well-developed. He is not ill-appearing or diaphoretic.  HENT:     Head: Normocephalic and atraumatic.     Right Ear: Tympanic membrane normal.     Left Ear: Tympanic membrane normal.  Eyes:     Extraocular Movements: Extraocular movements intact.     Pupils: Pupils are equal, round, and reactive to light.  Cardiovascular:     Rate and Rhythm: Normal rate and regular rhythm.     Heart sounds: Normal heart sounds.  Pulmonary:     Effort: Pulmonary effort is normal.     Breath sounds: Normal breath sounds.  Abdominal:     Palpations: Abdomen is soft.     Tenderness: There is  no abdominal tenderness.  Musculoskeletal:     Cervical back: No rigidity.  Skin:    General: Skin is warm and dry.  Neurological:     Mental Status: He is alert.     Comments: CN 3-12 grossly intact. 5/5 strength in all 4 extremities. Grossly normal sensation. Normal finger to nose.      ED Results / Procedures / Treatments   Labs (all labs ordered are listed, but only abnormal results are displayed) Labs Reviewed  COMPREHENSIVE METABOLIC PANEL - Abnormal; Notable for the following components:      Result Value   AST 14 (*)    All other components within normal limits  RESP PANEL BY RT-PCR (RSV, FLU A&B, COVID)  [id number]  CBC WITH DIFFERENTIAL/PLATELET  TROPONIN I (HIGH SENSITIVITY)    EKG EKG Interpretation Date/Time:  Saturday November 02 2023 13:40:12 EST Ventricular Rate:  73 PR Interval:  171 QRS Duration:  90 QT Interval:  385 QTC Calculation: 425 R Axis:   -41  Text Interpretation: Sinus rhythm Left anterior fascicular block Low voltage, precordial leads Consider anterior infarct tachycardia resolved, otherwise similar to 2021 Confirmed by Pricilla Loveless [phone]) on 11/02/2023 1:46:42 PM  Radiology DG Chest 2 View Result Date: 11/02/2023 CLINICAL DATA:  Chest pressure, fatigue, generalized weakness, headache EXAM: CHEST - 2 VIEW COMPARISON:  06/11/2008 FINDINGS: Cardiomediastinal silhouette and pulmonary vasculature are within normal limits. Lungs are clear. IMPRESSION: No acute cardiopulmonary process. Electronically Signed   By: Acquanetta Belling M.D.   On: 11/02/2023 15:03    Procedures Procedures    Medications Ordered in ED Medications - No data to display  ED Course/ Medical Decision Making/ A&P                                 Medical Decision Making Amount and/or Complexity of Data Reviewed External Data Reviewed: labs and notes. Labs: ordered.    Details: WBC and platelets normal Radiology: ordered and independent interpretation performed.    Details: No pneumonia ECG/medicine tests: ordered and independent interpretation performed.    Details: No ischemia  Risk Prescription drug management.   Patient is overall well-appearing.  Vital signs are stable.  He has an unremarkable exam including a benign neuroexam.  He is having a lot of sinus pressure and perhaps this is sinusitis though would be a little atypical.  He did however have a recent viral infection and so that is certainly possible and ultimately we will treated with some Augmentin.  He does have a history of TTP but his platelets are normal at this time and I doubt that is playing a role today.  Doubt cardiac ischemia.  Doubt acute CNS infection or emergency.  I do not think CNS imaging is needed.  Will discharge home with return  precautions.        Final Clinical Impression(s) / ED Diagnoses Final diagnoses:  Sinus headache    Rx / DC Orders ED Discharge Orders          Ordered    amoxicillin-clavulanate (AUGMENTIN) 875-125 MG tablet  Every 12 hours        11/02/23 1515              Pricilla Loveless, MD 11/02/23 [phone]
